# Patient Record
Sex: Male | Born: 1985 | Race: White | Hispanic: Yes | Marital: Single | Smoking: Never smoker
Health system: Southern US, Community
[De-identification: ages and names within clinical notes are randomized; demographics above are authoritative.]

## PROBLEM LIST (undated history)

## (undated) DIAGNOSIS — K219 Gastro-esophageal reflux disease without esophagitis: Secondary | ICD-10-CM

## (undated) DIAGNOSIS — Q909 Down syndrome, unspecified: Secondary | ICD-10-CM

## (undated) DIAGNOSIS — L728 Other follicular cysts of the skin and subcutaneous tissue: Secondary | ICD-10-CM

## (undated) DIAGNOSIS — K429 Umbilical hernia without obstruction or gangrene: Secondary | ICD-10-CM

## (undated) HISTORY — PX: MYRINGOTOMY: SUR874

## (undated) HISTORY — PX: NO PAST SURGERIES: SHX2092

---
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
## 2004-02-16 ENCOUNTER — Emergency Department (HOSPITAL_COMMUNITY): Admission: EM | Admit: 2004-02-16 | Discharge: 2004-02-16 | Payer: Self-pay | Admitting: Emergency Medicine

## 2004-02-17 ENCOUNTER — Emergency Department (HOSPITAL_COMMUNITY): Admission: EM | Admit: 2004-02-17 | Discharge: 2004-02-17 | Payer: Self-pay | Admitting: Emergency Medicine

## 2004-07-31 ENCOUNTER — Emergency Department (HOSPITAL_COMMUNITY): Admission: EM | Admit: 2004-07-31 | Discharge: 2004-07-31 | Payer: Self-pay | Admitting: Emergency Medicine

## 2006-06-08 ENCOUNTER — Emergency Department (HOSPITAL_COMMUNITY): Admission: EM | Admit: 2006-06-08 | Discharge: 2006-06-08 | Payer: Self-pay | Admitting: Emergency Medicine

## 2008-06-02 ENCOUNTER — Emergency Department (HOSPITAL_COMMUNITY): Admission: EM | Admit: 2008-06-02 | Discharge: 2008-06-03 | Payer: Self-pay | Admitting: Emergency Medicine

## 2008-08-31 ENCOUNTER — Emergency Department (HOSPITAL_COMMUNITY): Admission: EM | Admit: 2008-08-31 | Discharge: 2008-08-31 | Payer: Self-pay | Admitting: Emergency Medicine

## 2009-02-03 ENCOUNTER — Emergency Department (HOSPITAL_COMMUNITY): Admission: EM | Admit: 2009-02-03 | Discharge: 2009-02-03 | Payer: Self-pay | Admitting: Emergency Medicine

## 2009-04-30 ENCOUNTER — Emergency Department (HOSPITAL_COMMUNITY): Admission: EM | Admit: 2009-04-30 | Discharge: 2009-04-30 | Payer: Self-pay | Admitting: Family Medicine

## 2010-03-31 ENCOUNTER — Emergency Department (HOSPITAL_COMMUNITY)
Admission: EM | Admit: 2010-03-31 | Discharge: 2010-03-31 | Payer: Self-pay | Source: Home / Self Care | Admitting: Emergency Medicine

## 2010-03-31 LAB — BASIC METABOLIC PANEL
BUN: 16 mg/dL (ref 6–23)
GFR calc Af Amer: >60 mL/min (ref >60)
GFR calc non Af Amer: >60 mL/min (ref >60)
Potassium: 3.9 mEq/L (ref 3.5–5.1)
Sodium: 138 mEq/L (ref 135–145)

## 2010-03-31 LAB — DIFFERENTIAL
Basophils Absolute: 0.1 10*3/uL (ref 0.0–0.1)
Basophils Relative: 0 % (ref 0–1)
Eosinophils Absolute: 0 10*3/uL (ref 0.0–0.7)
Eosinophils Relative: 0 % (ref 0–5)
Neutrophils Relative %: 92 % — ABNORMAL HIGH (ref 43–77)

## 2010-03-31 LAB — CBC
Platelets: 247 10*3/uL (ref 150–400)
RDW: 13.8 % (ref 11.5–15.5)
WBC: 19.5 10*3/uL — ABNORMAL HIGH (ref 4.0–10.5)

## 2010-04-02 ENCOUNTER — Emergency Department (HOSPITAL_COMMUNITY)
Admission: EM | Admit: 2010-04-02 | Discharge: 2010-04-02 | Disposition: A | Discharge status: Home / Self Care | Payer: Medicaid Other | Attending: Emergency Medicine | Admitting: Emergency Medicine

## 2010-04-02 DIAGNOSIS — J020 Streptococcal pharyngitis: Secondary | ICD-10-CM | POA: Insufficient documentation

## 2010-04-02 LAB — RAPID STREP SCREEN (MED CTR MEBANE ONLY): Streptococcus, Group A Screen (Direct): POSITIVE — AB

## 2010-05-15 ENCOUNTER — Emergency Department (HOSPITAL_COMMUNITY): Payer: Medicaid Other

## 2010-05-15 ENCOUNTER — Emergency Department (HOSPITAL_COMMUNITY)
Admission: EM | Admit: 2010-05-15 | Discharge: 2010-05-15 | Disposition: A | Discharge status: Home / Self Care | Payer: Medicaid Other | Attending: Emergency Medicine | Admitting: Emergency Medicine

## 2010-05-15 DIAGNOSIS — X58XXXA Exposure to other specified factors, initial encounter: Secondary | ICD-10-CM | POA: Insufficient documentation

## 2010-05-15 DIAGNOSIS — Y9229 Other specified public building as the place of occurrence of the external cause: Secondary | ICD-10-CM | POA: Insufficient documentation

## 2010-05-15 DIAGNOSIS — F79 Unspecified intellectual disabilities: Secondary | ICD-10-CM | POA: Insufficient documentation

## 2010-05-15 DIAGNOSIS — S9030XA Contusion of unspecified foot, initial encounter: Secondary | ICD-10-CM | POA: Insufficient documentation

## 2010-05-15 DIAGNOSIS — Q909 Down syndrome, unspecified: Secondary | ICD-10-CM | POA: Insufficient documentation

## 2010-05-15 DIAGNOSIS — M7989 Other specified soft tissue disorders: Secondary | ICD-10-CM | POA: Insufficient documentation

## 2010-06-03 LAB — DIFFERENTIAL
Eosinophils Relative: 0 % (ref 0–5)
Lymphocytes Relative: 3 % — ABNORMAL LOW (ref 12–46)
Lymphs Abs: 0.3 10*3/uL — ABNORMAL LOW (ref 0.7–4.0)

## 2010-06-03 LAB — CBC
HCT: 45.6 % (ref 39.0–52.0)
Platelets: 236 10*3/uL (ref 150–400)
RBC: 4.76 MIL/uL (ref 4.22–5.81)
WBC: 10.3 10*3/uL (ref 4.0–10.5)

## 2010-06-03 LAB — BASIC METABOLIC PANEL
BUN: 15 mg/dL (ref 6–23)
GFR calc Af Amer: >60 mL/min (ref >60)
GFR calc non Af Amer: >60 mL/min (ref >60)
Potassium: 3.8 mEq/L (ref 3.5–5.1)
Sodium: 136 mEq/L (ref 135–145)

## 2010-06-08 LAB — COMPREHENSIVE METABOLIC PANEL
Alkaline Phosphatase: 70 U/L (ref 39–117)
BUN: 19 mg/dL (ref 6–23)
Chloride: 101 mEq/L (ref 96–112)
Glucose, Bld: 103 mg/dL — ABNORMAL HIGH (ref 70–99)
Potassium: 3.9 mEq/L (ref 3.5–5.1)
Total Bilirubin: 0.6 mg/dL (ref 0.3–1.2)

## 2010-06-08 LAB — DIFFERENTIAL
Basophils Absolute: 0 10*3/uL (ref 0.0–0.1)
Basophils Relative: 0 % (ref 0–1)
Monocytes Absolute: 0.5 10*3/uL (ref 0.1–1.0)
Neutro Abs: 11.9 10*3/uL — ABNORMAL HIGH (ref 1.7–7.7)
Neutrophils Relative %: 93 % — ABNORMAL HIGH (ref 43–77)

## 2010-06-08 LAB — URINALYSIS, ROUTINE W REFLEX MICROSCOPIC
Glucose, UA: NEGATIVE mg/dL
Ketones, ur: 15 mg/dL — AB
pH: 6.5 (ref 5.0–8.0)

## 2010-06-08 LAB — CBC
HCT: 43.2 % (ref 39.0–52.0)
Hemoglobin: 14.9 g/dL (ref 13.0–17.0)
WBC: 12.7 10*3/uL — ABNORMAL HIGH (ref 4.0–10.5)

## 2010-06-11 LAB — RAPID STREP SCREEN (MED CTR MEBANE ONLY): Streptococcus, Group A Screen (Direct): POSITIVE — AB

## 2012-04-04 ENCOUNTER — Emergency Department (INDEPENDENT_AMBULATORY_CARE_PROVIDER_SITE_OTHER): Payer: Medicaid Other

## 2012-04-04 ENCOUNTER — Emergency Department (HOSPITAL_COMMUNITY)
Admission: EM | Admit: 2012-04-04 | Discharge: 2012-04-04 | Disposition: A | Discharge status: Home / Self Care | Payer: Medicaid Other | Source: Home / Self Care | Attending: Family Medicine | Admitting: Family Medicine

## 2012-04-04 ENCOUNTER — Encounter (HOSPITAL_COMMUNITY): Payer: Self-pay

## 2012-04-04 DIAGNOSIS — J069 Acute upper respiratory infection, unspecified: Secondary | ICD-10-CM

## 2012-04-04 HISTORY — DX: Down syndrome, unspecified: Q90.9

## 2012-04-04 MED ORDER — IPRATROPIUM BROMIDE 0.06 % NA SOLN: 2.0000 | Freq: Four times a day (QID) | NASAL | Status: DC

## 2012-04-04 MED ORDER — AZITHROMYCIN 250 MG PO TABS: ORAL_TABLET | ORAL | Status: DC

## 2012-04-04 NOTE — ED Notes (Signed)
Dry non productive cough for approx 3 weeks with a fever sometimes at night, tried Robitussin and Motrin , cough is getting worse

## 2012-04-04 NOTE — ED Provider Notes (Signed)
History     CSN: [id number]  Arrival date & time 04/04/12  1851   First MD Initiated Contact with Patient 04/04/12 1909      Chief Complaint  Patient presents with  . Cough    (Consider location/radiation/quality/duration/timing/severity/associated sxs/prior treatment) Patient is a 27 y.o. male presenting with cough. The history is provided by a parent.  Cough This is a new problem. The current episode started more than 1 week ago (3 weeks of sx., worse since yest with rhinorrhea.). The cough is non-productive. There has been no fever. Associated symptoms include rhinorrhea. Pertinent negatives include no chills, no sweats, no shortness of breath and no wheezing. He is not a smoker.    Past Medical History  Diagnosis Date  . Down syndrome     History reviewed. No pertinent past surgical history.  No family history on file.  History  Substance Use Topics  . Smoking status: Never Smoker   . Smokeless tobacco: Not on file  . Alcohol Use: No      Review of Systems  Constitutional: Negative.  Negative for chills.  HENT: Positive for congestion, rhinorrhea and postnasal drip.   Respiratory: Positive for cough. Negative for shortness of breath and wheezing.   Gastrointestinal: Negative.     Allergies  Review of patient's allergies indicates no known allergies.  Home Medications   Current Outpatient Rx  Name  Route  Sig  Dispense  Refill  . AZITHROMYCIN 250 MG PO TABS      Take as directed on pack   6 each   0   . IPRATROPIUM BROMIDE 0.06 % NA SOLN   Nasal   Place 2 sprays into the nose 4 (four) times daily.   15 mL   1     BP 123/98  Pulse 63  Temp 98.3 F (36.8 C) (Oral)  Resp 20  SpO2 98%  Physical Exam  Vitals reviewed. Constitutional: He is oriented to person, place, and time. He appears well-developed and well-nourished.  HENT:  Head: Normocephalic.  Right Ear: External ear normal.  Left Ear: External ear normal.  Nose: Mucosal edema and  rhinorrhea present.  Mouth/Throat: Oropharynx is clear and moist.  Eyes: Conjunctivae normal are normal. Pupils are equal, round, and reactive to light.  Neck: Normal range of motion. Neck supple.  Cardiovascular: Normal rate, regular rhythm, normal heart sounds and intact distal pulses.   Pulmonary/Chest: Effort normal and breath sounds normal.  Lymphadenopathy:    He has no cervical adenopathy.  Neurological: He is alert and oriented to person, place, and time.  Skin: Skin is warm and dry.    ED Course  Procedures (including critical care time)  Labs Reviewed - No data to display Dg Chest 2 View  04/04/2012  *RADIOLOGY REPORT*  Clinical Data: Cough.  Chest soreness.  CHEST - 2 VIEW  Comparison: 03/31/2010  Findings: Low lung volumes are present, causing crowding of the pulmonary vasculature.  Mildly enlarged cardiac silhouette is likely attributable to the low lung volumes.  Airway thickening may reflect bronchitis or reactive airways disease.  No airspace opacity is identified to suggest bacterial pneumonia pattern.  No pleural effusion identified.  IMPRESSION:  1. Airway thickening may reflect bronchitis or reactive airways disease.  No airspace opacity is identified to suggest bacterial pneumonia pattern. 2.  Low lung volumes on the frontal projection.   Original Report Authenticated By: Gaylyn Rong, M.D.      1. URI (upper respiratory infection)  MDM  X-rays reviewed and report per radiologist.         Linna Hoff, MD 04/05/12 1336

## 2013-08-25 ENCOUNTER — Encounter (HOSPITAL_COMMUNITY): Payer: Self-pay | Admitting: Emergency Medicine

## 2013-08-25 ENCOUNTER — Emergency Department (HOSPITAL_COMMUNITY): Payer: Medicaid Other

## 2013-08-25 ENCOUNTER — Emergency Department (HOSPITAL_COMMUNITY)
Admission: EM | Admit: 2013-08-25 | Discharge: 2013-08-25 | Disposition: A | Discharge status: Home / Self Care | Payer: Medicaid Other | Attending: Emergency Medicine | Admitting: Emergency Medicine

## 2013-08-25 DIAGNOSIS — R197 Diarrhea, unspecified: Secondary | ICD-10-CM | POA: Insufficient documentation

## 2013-08-25 DIAGNOSIS — R059 Cough, unspecified: Secondary | ICD-10-CM

## 2013-08-25 DIAGNOSIS — R05 Cough: Secondary | ICD-10-CM | POA: Insufficient documentation

## 2013-08-25 DIAGNOSIS — Q909 Down syndrome, unspecified: Secondary | ICD-10-CM | POA: Insufficient documentation

## 2013-08-25 DIAGNOSIS — R112 Nausea with vomiting, unspecified: Secondary | ICD-10-CM | POA: Insufficient documentation

## 2013-08-25 DIAGNOSIS — R1110 Vomiting, unspecified: Secondary | ICD-10-CM

## 2013-08-25 LAB — CBC WITH DIFFERENTIAL/PLATELET
Basophils Absolute: 0 K/uL (ref 0.0–0.1)
Basophils Relative: 0 % (ref 0–1)
Eosinophils Absolute: 0 K/uL (ref 0.0–0.7)
Eosinophils Relative: 0 % (ref 0–5)
HCT: 41.2 % (ref 39.0–52.0)
Hemoglobin: 14.2 g/dL (ref 13.0–17.0)
Lymphocytes Relative: 5 % — ABNORMAL LOW (ref 12–46)
Lymphs Abs: 0.5 K/uL — ABNORMAL LOW (ref 0.7–4.0)
MCH: 32.1 pg (ref 26.0–34.0)
MCHC: 34.5 g/dL (ref 30.0–36.0)
MCV: 93.2 fL (ref 78.0–100.0)
Monocytes Absolute: 0.5 K/uL (ref 0.1–1.0)
Monocytes Relative: 5 % (ref 3–12)
Neutro Abs: 9.4 K/uL — ABNORMAL HIGH (ref 1.7–7.7)
Neutrophils Relative %: 90 % — ABNORMAL HIGH (ref 43–77)
Platelets: 229 K/uL (ref 150–400)
RBC: 4.42 MIL/uL (ref 4.22–5.81)
RDW: 14 % (ref 11.5–15.5)
WBC: 10.4 K/uL (ref 4.0–10.5)

## 2013-08-25 LAB — COMPREHENSIVE METABOLIC PANEL
ALK PHOS: 86 U/L (ref 39–117)
ALT: 17 U/L (ref 0–53)
AST: 18 U/L (ref 0–37)
Albumin: 3.9 g/dL (ref 3.5–5.2)
BILIRUBIN TOTAL: 0.3 mg/dL (ref 0.3–1.2)
BUN: 10 mg/dL (ref 6–23)
CHLORIDE: 100 meq/L (ref 96–112)
CO2: 27 mEq/L (ref 19–32)
CREATININE: 1.13 mg/dL (ref 0.50–1.35)
Calcium: 8.9 mg/dL (ref 8.4–10.5)
GFR calc non Af Amer: 87 mL/min — ABNORMAL LOW (ref >90)
GLUCOSE: 96 mg/dL (ref 70–99)
POTASSIUM: 3.8 meq/L (ref 3.7–5.3)
Sodium: 138 mEq/L (ref 137–147)
Total Protein: 7.3 g/dL (ref 6.0–8.3)

## 2013-08-25 LAB — LIPASE, BLOOD: Lipase: 21 U/L (ref 11–59)

## 2013-08-25 MED ORDER — ONDANSETRON 4 MG PO TBDP
4.0000 mg | ORAL_TABLET | Freq: Once | ORAL | Status: AC
  Administered 2013-08-25: 4 mg via ORAL
  Filled 2013-08-25: qty 1

## 2013-08-25 MED ORDER — ONDANSETRON HCL 4 MG PO TABS: 4.0000 mg | ORAL_TABLET | Freq: Four times a day (QID) | ORAL | Status: DC

## 2013-08-25 MED ORDER — BENZONATATE 100 MG PO CAPS: 100.0000 mg | ORAL_CAPSULE | Freq: Three times a day (TID) | ORAL | Status: DC

## 2013-08-25 NOTE — ED Provider Notes (Signed)
CSN: [id number]     Arrival date & time 08/25/13  1935 History   First MD Initiated Contact with Patient 08/25/13 2033     Chief Complaint  Patient presents with  . Cough  . Emesis     (Consider location/radiation/quality/duration/timing/severity/associated sxs/prior Treatment) HPI Level V caveat- pt has down syndrome Patient to the ER bib father for evaluation of his coughing and vomiting. He has down syndrome and is an otherwise healthy patient who does not take any daily medications.  He goes to an activity home during week days and today he had lunch there and started vomiting afterwards. The father says the activity place called him and said that a couple of the patients have been vomiting since having lunch. The father says he has not been acting as though he is having pain. He has not had any fever. He has had loose stool that is non mucous and non bloody. His vomit is whatever food or drink, non bilious and non bloody. He has not had anything for the vomiting prior to arrival. The father feels that he is now coughing before he vomiting. He reports that otherwise he has been acting normal and he is concerned that it may be a virus. Patient denies having pain. Filed Vitals:   08/25/13 2009  BP: 126/75  Pulse: 85  Temp: 98.4 F (36.9 C)  Resp: 20     Past Medical History  Diagnosis Date  . Down syndrome    Past Surgical History  Procedure Laterality Date  . Myringotomy     Family History  Problem Relation Age of Onset  . Hypertension Mother   . Cancer Mother    History  Substance Use Topics  . Smoking status: Never Smoker   . Smokeless tobacco: Not on file  . Alcohol Use: No    Review of Systems   Review of Systems  Gen: no weight loss, fevers, chills, night sweats  Eyes: no discharge or drainage, no occular pain or visual changes  Nose: no epistaxis or rhinorrhea  Mouth: no dental pain, no sore throat  Neck: no neck pain  Lungs:No wheezing, coughing or  hemoptysis CV: no chest pain, palpitations, dependent edema or orthopnea  Abd: no abdominal pain, + nausea, vomiting, diarrhea GU: no dysuria or gross hematuria  MSK:  No muscle weakness or pain Neuro: no headache, no focal neurologic deficits  Skin: no rash or wounds Psyche: no complaints    Allergies  Review of patient's allergies indicates no known allergies.  Home Medications   Prior to Admission medications   Medication Sig Start Date End Date Taking? Authorizing Tanairy Payeur  ibuprofen (ADVIL,MOTRIN) 200 MG tablet Take 200 mg by mouth every 6 (six) hours as needed (pain).   Yes Historical Ardyn Forge, MD  benzonatate (TESSALON) 100 MG capsule Take 1 capsule (100 mg total) by mouth every 8 (eight) hours. 08/25/13   Tiffany Irine SealG Greene, PA-C   BP 126/75  Pulse 85  Temp(Src) 98.4 F (36.9 C) (Oral)  Resp 20  SpO2 96% Physical Exam  Nursing note and vitals reviewed. Constitutional: He appears well-developed and well-nourished. No distress.  HENT:  Head: Normocephalic and atraumatic.  Right Ear: External ear normal.  Left Ear: External ear normal.  Nose: Nose normal.  Mouth/Throat: Oropharynx is clear and moist.  Eyes: Pupils are equal, round, and reactive to light.  Neck: Normal range of motion. Neck supple.  Cardiovascular: Normal rate and regular rhythm.   Pulmonary/Chest: Effort normal.  Abdominal: Soft.  He exhibits no distension and no fluid wave. Bowel sounds are increased. There is no hepatosplenomegaly. There is no tenderness. There is no rigidity, no rebound, no guarding and no CVA tenderness.  Neurological: He is alert.  Pt mentating at baseline per father.   Skin: Skin is warm and dry.    ED Course  Procedures (including critical care time) Labs Review Labs Reviewed  CBC WITH DIFFERENTIAL - Abnormal; Notable for the following:    Neutrophils Relative % 90 (*)    Neutro Abs 9.4 (*)    Lymphocytes Relative 5 (*)    Lymphs Abs 0.5 (*)    All other components  within normal limits  COMPREHENSIVE METABOLIC PANEL - Abnormal; Notable for the following:    GFR calc non Af Amer 87 (*)    All other components within normal limits  LIPASE, BLOOD    Imaging Review Dg Abd Acute W/chest  08/25/2013   CLINICAL DATA:  Cough.  Vomiting.  EXAM: ACUTE ABDOMEN SERIES (ABDOMEN 2 VIEW & CHEST 1 VIEW)  COMPARISON:  April 04, 2012.  FINDINGS: There is no evidence of dilated bowel loops or free intraperitoneal air. No radiopaque calculi or other significant radiographic abnormality is seen. Heart size and mediastinal contours are within normal limits. Both lungs are clear.  IMPRESSION: Negative abdominal radiographs.  No acute cardiopulmonary disease.   Electronically Signed   By: Roque LiasJames  Green M.D.   On: 08/25/2013 21:35     EKG Interpretation None      MDM   Final diagnoses:  Cough  Post-tussive vomiting    Labs and imaging are reassuring. Pt passed fluids challenge in the ED. Dr. Ethelda ChickJacubowitz has seen patient as well. Will rx Tessalon perls and Zofran. Pt to follow-up with PCP..  28 y.o.Levi Powell D Fetterolf's evaluation in the Emergency Department is complete. It has been determined that no acute conditions requiring further emergency intervention are present at this time. The patient/guardian have been advised of the diagnosis and plan. We have discussed signs and symptoms that warrant return to the ED, such as changes or worsening in symptoms.  Vital signs are stable at discharge. Filed Vitals:   08/25/13 2009  BP: 126/75  Pulse: 85  Temp: 98.4 F (36.9 C)  Resp: 20    Patient/guardian has voiced understanding and agreed to follow-up with the PCP or specialist.     Dorthula Matasiffany G Greene, PA-C 08/25/13 2258  Dorthula Matasiffany G Greene, PA-C 08/25/13 2259

## 2013-08-25 NOTE — ED Provider Notes (Signed)
Level V caveat patient down syndrome Patient with nonproductive cough and posttussive vomiting which started yesterday patient reports he is presently hungry. Treated at home with ibuprofen. On exam patient is alert not ill-appearing. Lungs clear auscultation heart regular in rhythm abdomen soft nontender.  Doug SouSam Jacubowitz, MD 08/25/13 2219

## 2013-08-25 NOTE — Discharge Instructions (Signed)
Cough, Adult  A cough is a reflex that helps clear your throat and airways. It can help heal the body or may be a reaction to an irritated airway. A cough may only last 2 or 3 weeks (acute) or may last more than 8 weeks (chronic).  CAUSES Acute cough:  Viral or bacterial infections. Chronic cough:  Infections.  Allergies.  Asthma.  Post-nasal drip.  Smoking.  Heartburn or acid reflux.  Some medicines.  Chronic lung problems (COPD).  Cancer. SYMPTOMS   Cough.  Fever.  Chest pain.  Increased breathing rate.  High-pitched whistling sound when breathing (wheezing).  Colored mucus that you cough up (sputum). TREATMENT   A bacterial cough may be treated with antibiotic medicine.  A viral cough must run its course and will not respond to antibiotics.  Your caregiver may recommend other treatments if you have a chronic cough. HOME CARE INSTRUCTIONS   Only take over-the-counter or prescription medicines for pain, discomfort, or fever as directed by your caregiver. Use cough suppressants only as directed by your caregiver.  Use a cold steam vaporizer or humidifier in your bedroom or home to help loosen secretions.  Sleep in a semi-upright position if your cough is worse at night.  Rest as needed.  Stop smoking if you smoke. SEEK IMMEDIATE MEDICAL CARE IF:   You have pus in your sputum.  Your cough starts to worsen.  You cannot control your cough with suppressants and are losing sleep.  You begin coughing up blood.  You have difficulty breathing.  You develop pain which is getting worse or is uncontrolled with medicine.  You have a fever. MAKE SURE YOU:   Understand these instructions.  Will watch your condition.  Will get help right away if you are not doing well or get worse. Document Released: 08/15/2010 Document Revised: 05/11/2011 Document Reviewed: 08/15/2010 [employer] Patient Information 2015 [employer]. This information is not intended  to replace advice given to you by your health care provider. Make sure you discuss any questions you have with your health care provider.  

## 2013-08-25 NOTE — ED Notes (Signed)
Visitor states pt started having vomiting this morning and has had soft stools like the beginning of diarrhea  Pt has a nonproductive persistant cough noted in triage  Family states that started today as well  Family states pt was fine yesterday

## 2013-08-26 NOTE — ED Provider Notes (Signed)
Medical screening examination/treatment/procedure(s) were conducted as a shared visit with non-physician practitioner(s) and myself.  I personally evaluated the patient during the encounter.   EKG Interpretation None       Sam Jacubowitz, MD 08/26/13 0044 

## 2014-09-26 ENCOUNTER — Encounter (HOSPITAL_COMMUNITY): Payer: Self-pay | Admitting: *Deleted

## 2014-09-26 ENCOUNTER — Emergency Department (HOSPITAL_COMMUNITY)
Admission: EM | Admit: 2014-09-26 | Discharge: 2014-09-26 | Disposition: A | Discharge status: Home / Self Care | Payer: Medicaid Other | Attending: Emergency Medicine | Admitting: Emergency Medicine

## 2014-09-26 DIAGNOSIS — R1110 Vomiting, unspecified: Secondary | ICD-10-CM | POA: Insufficient documentation

## 2014-09-26 DIAGNOSIS — R109 Unspecified abdominal pain: Secondary | ICD-10-CM | POA: Insufficient documentation

## 2014-09-26 DIAGNOSIS — J302 Other seasonal allergic rhinitis: Secondary | ICD-10-CM

## 2014-09-26 DIAGNOSIS — R05 Cough: Secondary | ICD-10-CM | POA: Diagnosis present

## 2014-09-26 DIAGNOSIS — Q909 Down syndrome, unspecified: Secondary | ICD-10-CM | POA: Insufficient documentation

## 2014-09-26 DIAGNOSIS — R059 Cough, unspecified: Secondary | ICD-10-CM

## 2014-09-26 MED ORDER — GUAIFENESIN-DM 100-10 MG/5ML PO SYRP: 5.0000 mL | ORAL_SOLUTION | ORAL | Status: DC | PRN

## 2014-09-26 MED ORDER — AZITHROMYCIN 250 MG PO TABS: 250.0000 mg | ORAL_TABLET | Freq: Every day | ORAL | Status: DC

## 2014-09-26 MED ORDER — LORATADINE 10 MG PO TABS: 10.0000 mg | ORAL_TABLET | Freq: Every day | ORAL | Status: DC

## 2014-09-26 MED ORDER — FLUTICASONE PROPIONATE 50 MCG/ACT NA SUSP: 2.0000 | Freq: Every day | NASAL | Status: DC

## 2014-09-26 NOTE — Discharge Instructions (Signed)
Allergic Rhinitis Allergic rhinitis is when the mucous membranes in the nose respond to allergens. Allergens are particles in the air that cause your body to have an allergic reaction. This causes you to release allergic antibodies. Through a chain of events, these eventually cause you to release histamine into the blood stream. Although meant to protect the body, it is this release of histamine that causes your discomfort, such as frequent sneezing, congestion, and an itchy, runny nose.  CAUSES  Seasonal allergic rhinitis (hay fever) is caused by pollen allergens that may come from grasses, trees, and weeds. Year-round allergic rhinitis (perennial allergic rhinitis) is caused by allergens such as house dust mites, pet dander, and mold spores.  SYMPTOMS   Nasal stuffiness (congestion).  Itchy, runny nose with sneezing and tearing of the eyes. DIAGNOSIS  Your health care provider can help you determine the allergen or allergens that trigger your symptoms. If you and your health care provider are unable to determine the allergen, skin or blood testing may be used. TREATMENT  Allergic rhinitis does not have a cure, but it can be controlled by:  Medicines and allergy shots (immunotherapy).  Avoiding the allergen. Hay fever may often be treated with antihistamines in pill or nasal spray forms. Antihistamines block the effects of histamine. There are over-the-counter medicines that may help with nasal congestion and swelling around the eyes. Check with your health care provider before taking or giving this medicine.  If avoiding the allergen or the medicine prescribed do not work, there are many new medicines your health care provider can prescribe. Stronger medicine may be used if initial measures are ineffective. Desensitizing injections can be used if medicine and avoidance does not work. Desensitization is when a patient is given ongoing shots until the body becomes less sensitive to the allergen.  Make sure you follow up with your health care provider if problems continue. HOME CARE INSTRUCTIONS It is not possible to completely avoid allergens, but you can reduce your symptoms by taking steps to limit your exposure to them. It helps to know exactly what you are allergic to so that you can avoid your specific triggers. SEEK MEDICAL CARE IF:   You have a fever.  You develop a cough that does not stop easily (persistent).  You have shortness of breath.  You start wheezing.  Symptoms interfere with normal daily activities. Document Released: 11/11/2000 Document Revised: 02/21/2013 Document Reviewed: 10/24/2012 [facility] Patient Information 2015 [place], [place]. This information is not intended to replace advice given to you by your health care provider. Make sure you discuss any questions you have with your health care provider.  Please use medication as directed. Please contact her primary care physician and inform them of your visit today, please return immediately if new or worsening signs or symptoms present.

## 2014-09-26 NOTE — ED Notes (Addendum)
Pt complains of n/v/left side abd pain since yesterday afternoon. Pt's mother states the pt appeared to be sick after coming home from camp yesterday. Pt's mother states the pt has also had cough/congestion/sore throat for the past 3 days. Pt has hx of down syndrome

## 2014-09-26 NOTE — ED Provider Notes (Signed)
CSN: [id number]     Arrival date & time 09/26/14  1245 History   None    No chief complaint on file.   HPI   Level V caveat due to Down syndrome   29 year old male presents today with family with complaints of upper respiratory symptoms, cough, and subsequent vomiting. Family notes that once a year patient experiences similar symptoms, stating that he has been going to a recreational camp outside noting that since then he's had a runny nose, watery eyes, and nonproductive cough. They state that symptoms are worse when he comes home, improved to stay at home. They note that over the last 2 days the cough has worsened in frequency, resulting in vomiting after coughing spells. Denies respiratory distress, fever, chills, vomiting without cough, rash, insect bites, or any other people can't experiencing similar symptoms. They deny a history of allergies, reports similar symptoms with outside exposure. They state that other times a year he remains inside with little exposure to the outdoors. They report patient has been eating and drinking appropriately, but notes that after severe coughing spells he vomits. Patient is minimally verbal, reports throat pain, and abdominal discomfort. Family notes that he has been complaining of the throat more than anything, they're not concerned that his abdominal discomfort. Patient does not currently take any over-the-counter medications, has been acting appropriately, with normal bowel and bladder habits. Family notes that they have been checking him for tick bites and noted no rash or insect bites.  Past Medical History  Diagnosis Date  . Down syndrome    Past Surgical History  Procedure Laterality Date  . Myringotomy     Family History  Problem Relation Age of Onset  . Hypertension Mother   . Cancer Mother    History  Substance Use Topics  . Smoking status: Never Smoker   . Smokeless tobacco: Not on file  . Alcohol Use: No    Review of Systems  All  other systems reviewed and are negative.   Allergies  Review of patient's allergies indicates no known allergies.  Home Medications   Prior to Admission medications   Medication Sig Start Date End Date Taking? Authorizing Provider  ibuprofen (ADVIL,MOTRIN) 200 MG tablet Take 200 mg by mouth every 6 (six) hours as needed (pain).   Yes Historical Provider, MD  azithromycin (ZITHROMAX) 250 MG tablet Take 1 tablet (250 mg total) by mouth daily. Take first 2 tablets together, then 1 every day until finished. 09/26/14   Eyvonne Mechanic, PA-C  fluticasone (FLONASE) 50 MCG/ACT nasal spray Place 2 sprays into both nostrils daily. 09/26/14   Tinnie Gens Deangleo Passage, PA-C  guaiFENesin-dextromethorphan (ROBITUSSIN DM) 100-10 MG/5ML syrup Take 5 mLs by mouth every 4 (four) hours as needed for cough. 09/26/14   Eyvonne Mechanic, PA-C  loratadine (CLARITIN) 10 MG tablet Take 1 tablet (10 mg total) by mouth daily. 09/26/14   Aleasha Fregeau, PA-C   BP 132/76 mmHg  Pulse 72  Temp(Src) 98.2 F (36.8 C) (Oral)  Resp 20  SpO2 98%   Physical Exam  Constitutional: He is oriented to person, place, and time. He appears well-developed and well-nourished.  HENT:  Head: Normocephalic and atraumatic.  Right Ear: Hearing, tympanic membrane, external ear and ear canal normal.  Left Ear: Hearing, tympanic membrane, external ear and ear canal normal.  Nose: Mucosal edema and rhinorrhea present.  Mouth/Throat: Uvula is midline, oropharynx is clear and moist and mucous membranes are normal. No oropharyngeal exudate, posterior oropharyngeal edema, posterior oropharyngeal erythema  or tonsillar abscesses.  Eyes: Conjunctivae are normal. Pupils are equal, round, and reactive to light. Right eye exhibits no discharge. Left eye exhibits no discharge. No scleral icterus.  Neck: Normal range of motion. No JVD present. No tracheal deviation present.  Cardiovascular: Normal rate, regular rhythm, normal heart sounds and intact distal pulses.   Exam reveals no gallop and no friction rub.   No murmur heard. Pulmonary/Chest: Effort normal and breath sounds normal. No stridor. No respiratory distress. He has no wheezes. He has no rales. He exhibits no tenderness.  Abdominal: Soft. Bowel sounds are normal. He exhibits no distension and no mass. There is no tenderness. There is no rebound and no guarding.  Musculoskeletal: Normal range of motion. He exhibits no edema.  Neurological: He is alert and oriented to person, place, and time. Coordination normal.  Skin: Skin is warm and dry. No rash noted. No erythema. No pallor.  No rashes or insect bites  Psychiatric: He has a normal mood and affect. His behavior is normal. Judgment and thought content normal.  Nursing note and vitals reviewed.     ED Course  Procedures (including critical care time) Labs Review Labs Reviewed - No data to display  Imaging Review No results found.   EKG Interpretation None      MDM   Final diagnoses:  Other seasonal allergic rhinitis  Cough    Labs:  Imaging:  Consults:  Therapeutics:   Discharge Meds: Claritin, Flonase, azithromycin  Assessment/Plan: Patient presents with likely allergies. Similar presentation over the last several years while attending camp outside, normally inside. Rhinorrhea, cough worse with laying back, improved with avoidance of outdoor exposure. Patient is afebrile, vital signs normal, lungs sounds clear. No difficulty swallowing, drooling, dysphonia,muffled voice, stridor, swelling of the neck, trismus, mouth pain, swelling/ pain in submandibular area or floor of mouth ,assymetry of tonsils, or ulcerations; unlikely epiglottitis, PTA, submandibular space infection, retropharyngeal space infection, or HIV. Patient's family instructed to use Claritin, Flonase, avoid allergens. They report receiving azithromycin previously that help alleviate symptoms, I informed this is likely allergic, less likely viral, even more  less likely bacterial. Patient will be given a course of azithromycin, they're strongly encouraged to avoid using antibiotics unless symptoms worsen or do not improve after 7 days. Patient's family given strict return precautions, PCP follow-up for re-evaluation if symptoms persist beyond 5-7 days in duration, return to the ED if they worsen. Pt verbalized understanding and agreement to today's plan and had no further questions or concerns at the time of discharge.          Eyvonne Mechanic, PA-C 09/27/14 1610  Blake Divine, MD 09/28/14 [phone]

## 2015-03-20 ENCOUNTER — Emergency Department (HOSPITAL_COMMUNITY): Payer: Medicare Other

## 2015-03-20 ENCOUNTER — Encounter (HOSPITAL_COMMUNITY): Payer: Self-pay | Admitting: Emergency Medicine

## 2015-03-20 ENCOUNTER — Inpatient Hospital Stay (HOSPITAL_COMMUNITY)
Admission: EM | Admit: 2015-03-20 | Discharge: 2015-03-22 | DRG: 872 | Disposition: A | Discharge status: Home / Self Care | Payer: Medicare Other | Attending: Internal Medicine | Admitting: Internal Medicine

## 2015-03-20 DIAGNOSIS — R1111 Vomiting without nausea: Secondary | ICD-10-CM | POA: Diagnosis not present

## 2015-03-20 DIAGNOSIS — D72829 Elevated white blood cell count, unspecified: Secondary | ICD-10-CM

## 2015-03-20 DIAGNOSIS — Q909 Down syndrome, unspecified: Secondary | ICD-10-CM

## 2015-03-20 DIAGNOSIS — R6520 Severe sepsis without septic shock: Secondary | ICD-10-CM | POA: Diagnosis present

## 2015-03-20 DIAGNOSIS — R1110 Vomiting, unspecified: Secondary | ICD-10-CM | POA: Diagnosis present

## 2015-03-20 DIAGNOSIS — R05 Cough: Secondary | ICD-10-CM | POA: Diagnosis not present

## 2015-03-20 DIAGNOSIS — R059 Cough, unspecified: Secondary | ICD-10-CM

## 2015-03-20 DIAGNOSIS — Z79899 Other long term (current) drug therapy: Secondary | ICD-10-CM

## 2015-03-20 DIAGNOSIS — A419 Sepsis, unspecified organism: Secondary | ICD-10-CM | POA: Diagnosis not present

## 2015-03-20 DIAGNOSIS — R509 Fever, unspecified: Secondary | ICD-10-CM | POA: Diagnosis not present

## 2015-03-20 DIAGNOSIS — N3001 Acute cystitis with hematuria: Secondary | ICD-10-CM | POA: Diagnosis present

## 2015-03-20 DIAGNOSIS — N3090 Cystitis, unspecified without hematuria: Secondary | ICD-10-CM | POA: Diagnosis not present

## 2015-03-20 DIAGNOSIS — N390 Urinary tract infection, site not specified: Secondary | ICD-10-CM | POA: Diagnosis present

## 2015-03-20 LAB — PROTIME-INR
INR: 1.15 (ref 0.00–1.49)
Prothrombin Time: 14.9 seconds (ref 11.6–15.2)

## 2015-03-20 LAB — COMPREHENSIVE METABOLIC PANEL
ALT: 18 U/L (ref 17–63)
ANION GAP: 10 (ref 5–15)
AST: 17 U/L (ref 15–41)
Albumin: 3.9 g/dL (ref 3.5–5.0)
Alkaline Phosphatase: 63 U/L (ref 38–126)
BUN: 15 mg/dL (ref 6–20)
CO2: 26 mmol/L (ref 22–32)
Calcium: 8.7 mg/dL — ABNORMAL LOW (ref 8.9–10.3)
Chloride: 101 mmol/L (ref 101–111)
Creatinine, Ser: 1.21 mg/dL (ref 0.61–1.24)
Glucose, Bld: 122 mg/dL — ABNORMAL HIGH (ref 65–99)
POTASSIUM: 3.7 mmol/L (ref 3.5–5.1)
Sodium: 137 mmol/L (ref 135–145)
Total Bilirubin: 0.8 mg/dL (ref 0.3–1.2)
Total Protein: 7.5 g/dL (ref 6.5–8.1)

## 2015-03-20 LAB — URINALYSIS, ROUTINE W REFLEX MICROSCOPIC
GLUCOSE, UA: NEGATIVE mg/dL
Ketones, ur: NEGATIVE mg/dL
Nitrite: NEGATIVE
PH: 6 (ref 5.0–8.0)
Protein, ur: 30 mg/dL — AB
SPECIFIC GRAVITY, URINE: 1.027 (ref 1.005–1.030)

## 2015-03-20 LAB — URINE MICROSCOPIC-ADD ON

## 2015-03-20 LAB — CBC
HEMATOCRIT: 39.5 % (ref 39.0–52.0)
HEMOGLOBIN: 13.1 g/dL (ref 13.0–17.0)
MCH: 31.3 pg (ref 26.0–34.0)
MCHC: 33.2 g/dL (ref 30.0–36.0)
MCV: 94.5 fL (ref 78.0–100.0)
PLATELETS: 297 10*3/uL (ref 150–400)
RBC: 4.18 MIL/uL — AB (ref 4.22–5.81)
RDW: 14.2 % (ref 11.5–15.5)
WBC: 20.1 10*3/uL — AB (ref 4.0–10.5)

## 2015-03-20 LAB — APTT: APTT: 28 s (ref 24–37)

## 2015-03-20 LAB — LIPASE, BLOOD: LIPASE: 14 U/L (ref 11–51)

## 2015-03-20 LAB — LACTIC ACID, PLASMA: LACTIC ACID, VENOUS: 0.7 mmol/L (ref 0.5–2.0)

## 2015-03-20 LAB — I-STAT CG4 LACTIC ACID, ED
LACTIC ACID, VENOUS: 0.69 mmol/L (ref 0.5–2.0)
Lactic Acid, Venous: 1.12 mmol/L (ref 0.5–2.0)

## 2015-03-20 LAB — BRAIN NATRIURETIC PEPTIDE: B Natriuretic Peptide: 43.7 pg/mL (ref 0.0–100.0)

## 2015-03-20 LAB — PROCALCITONIN

## 2015-03-20 MED ORDER — HYDROCODONE-ACETAMINOPHEN 5-325 MG PO TABS
1.0000 | ORAL_TABLET | ORAL | Status: DC | PRN
  Administered 2015-03-21: 1 via ORAL
  Filled 2015-03-20: qty 1

## 2015-03-20 MED ORDER — ONDANSETRON HCL 4 MG PO TABS: 4.0000 mg | ORAL_TABLET | Freq: Four times a day (QID) | ORAL | Status: DC | PRN

## 2015-03-20 MED ORDER — CEFTRIAXONE SODIUM 2 G IJ SOLR
2.0000 g | Freq: Once | INTRAMUSCULAR | Status: AC
  Administered 2015-03-20: 2 g via INTRAVENOUS
  Filled 2015-03-20: qty 2

## 2015-03-20 MED ORDER — POTASSIUM CHLORIDE IN NACL 20-0.9 MEQ/L-% IV SOLN
INTRAVENOUS | Status: DC
  Administered 2015-03-20 – 2015-03-21 (×2): via INTRAVENOUS
  Filled 2015-03-20 (×3): qty 1000

## 2015-03-20 MED ORDER — BISACODYL 5 MG PO TBEC
5.0000 mg | DELAYED_RELEASE_TABLET | Freq: Every day | ORAL | Status: DC | PRN
  Filled 2015-03-20: qty 1

## 2015-03-20 MED ORDER — ONDANSETRON HCL 4 MG/2ML IJ SOLN: 4.0000 mg | Freq: Four times a day (QID) | INTRAMUSCULAR | Status: DC | PRN

## 2015-03-20 MED ORDER — SODIUM CHLORIDE 0.9 % IJ SOLN
3.0000 mL | Freq: Two times a day (BID) | INTRAMUSCULAR | Status: DC
Start: 2015-03-20 — End: 2015-03-22
  Administered 2015-03-20 – 2015-03-22 (×4): 3 mL via INTRAVENOUS

## 2015-03-20 MED ORDER — SENNOSIDES-DOCUSATE SODIUM 8.6-50 MG PO TABS: 1.0000 | ORAL_TABLET | Freq: Every evening | ORAL | Status: DC | PRN

## 2015-03-20 MED ORDER — SODIUM CHLORIDE 0.9 % IV SOLN
1000.0000 mL | Freq: Once | INTRAVENOUS | Status: AC
  Administered 2015-03-20: 1000 mL via INTRAVENOUS

## 2015-03-20 MED ORDER — SODIUM CHLORIDE 0.9 % IV SOLN
1000.0000 mL | INTRAVENOUS | Status: DC
  Administered 2015-03-20: 1000 mL via INTRAVENOUS

## 2015-03-20 MED ORDER — ENOXAPARIN SODIUM 40 MG/0.4ML ~~LOC~~ SOLN
40.0000 mg | Freq: Every day | SUBCUTANEOUS | Status: DC
  Filled 2015-03-20 (×2): qty 0.4

## 2015-03-20 MED ORDER — ACETAMINOPHEN 650 MG RE SUPP: 650.0000 mg | Freq: Four times a day (QID) | RECTAL | Status: DC | PRN

## 2015-03-20 MED ORDER — MORPHINE SULFATE (PF) 2 MG/ML IV SOLN: 1.0000 mg | INTRAVENOUS | Status: DC | PRN

## 2015-03-20 MED ORDER — LEVOFLOXACIN IN D5W 750 MG/150ML IV SOLN
750.0000 mg | Freq: Once | INTRAVENOUS | Status: AC
  Administered 2015-03-20: 750 mg via INTRAVENOUS
  Filled 2015-03-20: qty 150

## 2015-03-20 MED ORDER — ACETAMINOPHEN 325 MG PO TABS: 650.0000 mg | ORAL_TABLET | Freq: Four times a day (QID) | ORAL | Status: DC | PRN

## 2015-03-20 NOTE — ED Notes (Signed)
Per mother, on antibiotic for cough-coughing up mucous-states notice blood in his urine this am

## 2015-03-20 NOTE — H&P (Signed)
[facility] History and Physical  [name] BJY:[id number] DOB: 1985/06/12 DOA: 03/20/2015  Referring physician: ED physician PCP: Rubye Beach  Specialists:   Chief Complaint:  Fevers, cough, hematuria  HPI: Levi Powell is a 30 y.o. male with PMH of trisomy 71, but generally well, who presents to the ED after several weeks of intermittent fevers, cough, and myalgias, with gross hematuria noted this morning. Approximately 6 weeks ago, the patient developed a fever and cough, eventually saw his PCP, and was prescribed a course of amoxicillin. There was initial improvement and the patient defervesced, but over the last week he had been having increasingly fatigued, and on the morning of his admission his mother noted frank blood in his urine. The patient has had no history of kidney stones or UTI, and has never had gross hematuria previously.  In ED, patient was found to be afebrile, saturating well on room air, with heart rate initially in the 120s and blood pressure is marginal. A CT of the abdomen was obtained, demonstrating urinary bladder wall thickening, but no stones. Blood work returned with a leukocytosis greater than 20,000 and urinalysis features many bacteria and small leukocytes. Patient received a 1 L bolus of normal saline with subsequent improvement in tachycardia. An empiric dose of IV Levaquin was given for UTI and the patient was admitted to the hospital for ongoing evaluation and management of sepsis secondary to UTI.  Where does patient live?   At home    Can patient participate in ADLs?  Some   Review of Systems:   Unable to obtain secondary to patient's non-verbal status. His parents note post-tussive vomiting.   Allergy: No Known Allergies  Past Medical History  Diagnosis Date  . Down syndrome     Past Surgical History  Procedure Laterality Date  . Myringotomy    . No past surgeries      Social History:  reports that he has never  smoked. He does not have any smokeless tobacco history on file. He reports that he does not drink alcohol or use illicit drugs.  Family History:  Family History  Problem Relation Age of Onset  . Hypertension Mother   . Cancer Mother      Prior to Admission medications   Medication Sig Start Date End Date Taking? Authorizing Provider  azithromycin (ZITHROMAX) 250 MG tablet Take 1 tablet (250 mg total) by mouth daily. Take first 2 tablets together, then 1 every day until finished. Patient not taking: Reported on 03/20/2015 09/26/14   Eyvonne Mechanic, PA-C  fluticasone [facility]) 50 MCG/ACT nasal spray Place 2 sprays into both nostrils daily. Patient not taking: Reported on 03/20/2015 09/26/14   Eyvonne Mechanic, PA-C  guaiFENesin-dextromethorphan (ROBITUSSIN DM) 100-10 MG/5ML syrup Take 5 mLs by mouth every 4 (four) hours as needed for cough. Patient not taking: Reported on 03/20/2015 09/26/14   Eyvonne Mechanic, PA-C  ibuprofen (ADVIL,MOTRIN) 200 MG tablet Take 200 mg by mouth every 6 (six) hours as needed (pain).    Historical Provider, MD  loratadine (CLARITIN) 10 MG tablet Take 1 tablet (10 mg total) by mouth daily. Patient not taking: Reported on 03/20/2015 09/26/14   Eyvonne Mechanic, PA-C    Physical Exam: Filed Vitals:   03/20/15 1451 03/20/15 1715  BP: 98/71 130/74  Pulse: 123 90  Temp: 99 F (37.2 C)   TempSrc: Oral   Resp: 18 17  SpO2: 93% 97%   General: Not in acute distress. Down facies. HEENT:  Eyes: PERRL, EOMI, no scleral icterus or conjunctival pallor.       ENT: No discharge from the ears or nose, no pharyngeal ulcers, petechiae or exudate, no tonsillar enlargement.        Neck: No JVD, no bruit, no appreciable mass Heme: No cervical adenopathy, no pallor Cardiac: S1/S2, RRR, No murmurs, No gallops or rubs. Pulm: Good air movement bilaterally, no wheeze or ronchi. Abd: Soft, nondistended, suprapubic tenderness, no rebound pain or gaurding, no mass or organomegaly, BS  present. Ext: No LE edema bilaterally. 2+DP/PT pulse bilaterally. Musculoskeletal: No gross deformity, no red, hot, swollen joints, no limitation in ROM  Skin: No rashes or wounds on exposed surfaces  Neuro: Alert, non-verbal, cranial nerves II-XII grossly intact. No focal findings Psych: Patient is not overtly psychotic, calm, cooperative.  Labs on Admission:  Basic Metabolic Panel:  Recent Labs Lab 03/20/15 1545  NA 137  K 3.7  CL 101  CO2 26  GLUCOSE 122*  BUN 15  CREATININE 1.21  CALCIUM 8.7*   Liver Function Tests:  Recent Labs Lab 03/20/15 1545  AST 17  ALT 18  ALKPHOS 63  BILITOT 0.8  PROT 7.5  ALBUMIN 3.9    Recent Labs Lab 03/20/15 1545  LIPASE 14   No results for input(s): AMMONIA in the last 168 hours. CBC:  Recent Labs Lab 03/20/15 1545  WBC 20.1*  HGB 13.1  HCT 39.5  MCV 94.5  PLT 297   Cardiac Enzymes: No results for input(s): CKTOTAL, CKMB, CKMBINDEX, TROPONINI in the last 168 hours.  BNP (last 3 results) No results for input(s): BNP in the last 8760 hours.  ProBNP (last 3 results) No results for input(s): PROBNP in the last 8760 hours.  CBG: No results for input(s): GLUCAP in the last 168 hours.  Radiological Exams on Admission: Dg Chest 2 View  03/20/2015  CLINICAL DATA:  Cough for 7 weeks EXAM: CHEST  2 VIEW COMPARISON:  08/25/2013 FINDINGS: Cardiomediastinal silhouette is stable. No acute infiltrate or pleural effusion. No pulmonary edema. Mild infrahilar bronchitic changes. Bony thorax is stable. IMPRESSION: No acute infiltrate or pulmonary edema. Mild infrahilar bronchitic changes. Electronically Signed   By: Natasha Mead M.D.   On: 03/20/2015 15:36   Ct Renal Stone Study  03/20/2015  CLINICAL DATA:  30 year old with hematuria today. Fever. On antibiotics for cough. EXAM: CT ABDOMEN AND PELVIS WITHOUT CONTRAST TECHNIQUE: Multidetector CT imaging of the abdomen and pelvis was performed following the standard protocol without IV  contrast. COMPARISON:  Acute abdominal series done 08/25/2013. Abdominal CT 03/31/2010. FINDINGS: Lower chest: Clear lung bases. No significant pleural or pericardial effusion. Hepatobiliary: As evaluated in the noncontrast state, the liver appears normal without focal abnormality. No evidence of gallstones, gallbladder wall thickening or biliary dilatation. Pancreas: Unremarkable. No pancreatic ductal dilatation or surrounding inflammatory changes. Spleen: Normal in size without focal abnormality. Adrenals/Urinary Tract: Both adrenal glands appear normal. No evidence of urinary tract calculus or hydronephrosis. As evaluated in the noncontrast state, both kidneys appear unremarkable. There is moderate nonspecific bladder wall thickening. Stomach/Bowel: No evidence of bowel wall thickening, distention or surrounding inflammatory change. Vascular/Lymphatic: There are no enlarged abdominal or pelvic lymph nodes. No significant vascular findings on noncontrast imaging. Reproductive: Unremarkable. Other: Stable small umbilical hernia containing only fat. Musculoskeletal: No acute or significant osseous findings. IMPRESSION: 1. Nonspecific bladder wall thickening, likely in part due to incomplete distention. Correlation with urine analysis recommended to exclude cystitis. 2. No evidence of urinary tract calculus or  hydronephrosis. 3. No other significant findings. Electronically Signed   By: Carey Bullocks M.D.   On: 03/20/2015 17:32    EKG:  Not done in ED, will obtain as appropriate   Assessment/Plan  1. Sepsis secondary to acute cystitis  - Presents with sinus tachycardia and leukocytosis; UA suggestive of infection, CT supports this with notation of bladder wall thickening  - Tachycardia resolved with NS bolus in ED, continuing IVF with NS at 100 cc/hr  - Empiric Rocephin started while awaiting return of urine culture; blood cultures obtained  - Initial lactate normal  - Monitor on telemetry   2. Cough,  post-tussive vomiting  - CXR clear, breathing unlabored, saturating well on rm air  - Present intermittently for last 6 wks  - Uncertain etio, possibly viral URI, GERD, asthma, post-nasal drip  - Monitoring     DVT ppx:   SQ Lovenox    Code Status: Full code Family Communication:  Yes, patient's mother and father at bed side Disposition Plan: Admit to inpatient   Date of Service 03/20/2015    Briscoe Deutscher, MD [facility] Pager [phone]  If 7PM-7AM, please contact night-coverage [email or web address] Password [facility] 03/20/2015, 7:54 PM

## 2015-03-20 NOTE — Progress Notes (Signed)
ANTIBIOTIC CONSULT NOTE - INITIAL  Pharmacy Consult for Ceftriaxone Indication: UTI  No Known Allergies  Patient Measurements:    Vital Signs: Temp: 99 F (37.2 C) (01/18 1451) Temp Source: Oral (01/18 1451) BP: 130/74 mmHg (01/18 1715) Pulse Rate: 90 (01/18 1715) Intake/Output from previous day:    Labs:  Recent Labs  03/20/15 1545  WBC 20.1*  HGB 13.1  PLT 297  CREATININE 1.21   CrCl cannot be calculated (Unknown ideal weight.). No results for input(s): VANCOTROUGH, VANCOPEAK, VANCORANDOM, GENTTROUGH, GENTPEAK, GENTRANDOM, TOBRATROUGH, TOBRAPEAK, TOBRARND, AMIKACINPEAK, AMIKACINTROU, AMIKACIN in the last 72 hours.   Microbiology: No results found for this or any previous visit (from the past 720 hour(s)).  Medical History: Past Medical History  Diagnosis Date  . Down syndrome     Medications:  Anti-infectives    Start     Dose/Rate Route Frequency Ordered Stop   03/20/15 2000  cefTRIAXone (ROCEPHIN) 2 g in dextrose 5 % 50 mL IVPB     2 g 100 mL/hr over 30 Minutes Intravenous  Once 03/20/15 1954     03/20/15 1715  levofloxacin (LEVAQUIN) IVPB 750 mg     750 mg 100 mL/hr over 90 Minutes Intravenous  Once 03/20/15 1707 03/20/15 1925     Assessment: 29 yoM presented to ED on 1/18 with emesis, fever, cough, myalgia, and reported blood in urine.  He recently finished a course of outpatient amoxicillin.  Pharmacy is consulted to dose ceftriaxone for UTI.  Today, 03/20/2015: Tmax 99 WBC 20.1 SCr 1.21  Antimicrobials this admission: 1/18 >> Levaquin x1 only 1/18 >> Ceftriaxone >>  Levels/dose changes this admission:  Microbiology Results: 1/18 BCx: sent 1/18 UCx: sent    Goal of Therapy:  Appropriate abx dosing, eradication of infection.   Plan:  Ceftriaxone 1g IV q24h Dosage remains stable and need for further dosage adjustment appears unlikely at present.  Pharmacy wll sign off at this time.  Please reconsult if a change in clinical status  warrants re-evaluation of dosage.  Lynann Beaver PharmD, BCPS Pager [phone] 03/20/2015 7:59 PM

## 2015-03-20 NOTE — ED Notes (Signed)
No need to collect lactic acid

## 2015-03-20 NOTE — ED Provider Notes (Signed)
CSN: [id number]     Arrival date & time 03/20/15  1438 History   First MD Initiated Contact with Patient 03/20/15 1552     Chief Complaint  Patient presents with  . Emesis     (Consider location/radiation/quality/duration/timing/severity/associated sxs/prior Treatment) HPI Patient has had several episodes of illness in the past 6 weeks. 6 weeks ago he developed fevers, cough and myalgia. After several weeks he was treated with amoxicillin. He is nearing the end of the course of amoxicillin. He was improving, but now he has again developed a harsh coughing productive of mucus. His mother is also noted blood in his urine. He has some endorsement of abdominal pain. He indicates his abdomen more generally but somewhat epigastric and central. The parents report he has not had shortness of breath. Vomiting has predominantly been posttussive in nature mucus and some emesis. Patient does not endorse chest pain. They deny there been any other rashes, joint swelling or areas of skin redness. The patient does have Down syndrome but is otherwise healthy. Past Medical History  Diagnosis Date  . Down syndrome    Past Surgical History  Procedure Laterality Date  . Myringotomy     Family History  Problem Relation Age of Onset  . Hypertension Mother   . Cancer Mother    Social History  Substance Use Topics  . Smoking status: Never Smoker   . Smokeless tobacco: None  . Alcohol Use: No    Review of Systems  10 Systems reviewed and are negative for acute change except as noted in the HPI. Review systems is performed with parents however patient does nod in agreement and expresses input.  Allergies  Review of patient's allergies indicates no known allergies.  Home Medications   Prior to Admission medications   Medication Sig Start Date End Date Taking? Authorizing Provider  azithromycin (ZITHROMAX) 250 MG tablet Take 1 tablet (250 mg total) by mouth daily. Take first 2 tablets together, then 1  every day until finished. Patient not taking: Reported on 03/20/2015 09/26/14   Eyvonne Mechanic, PA-C  fluticasone [facility]) 50 MCG/ACT nasal spray Place 2 sprays into both nostrils daily. Patient not taking: Reported on 03/20/2015 09/26/14   Eyvonne Mechanic, PA-C  guaiFENesin-dextromethorphan (ROBITUSSIN DM) 100-10 MG/5ML syrup Take 5 mLs by mouth every 4 (four) hours as needed for cough. Patient not taking: Reported on 03/20/2015 09/26/14   Eyvonne Mechanic, PA-C  ibuprofen (ADVIL,MOTRIN) 200 MG tablet Take 200 mg by mouth every 6 (six) hours as needed (pain).    Historical Provider, MD  loratadine (CLARITIN) 10 MG tablet Take 1 tablet (10 mg total) by mouth daily. Patient not taking: Reported on 03/20/2015 09/26/14   Eyvonne Mechanic, PA-C   BP 130/74 mmHg  Pulse 90  Temp(Src) 99 F (37.2 C) (Oral)  Resp 17  SpO2 97% Physical Exam  Constitutional: He appears well-developed and well-nourished.  Patient has a facies of Down syndrome but is alert and nontoxic. He does not have acute respiratory distress.  HENT:  Head: Normocephalic and atraumatic.  Right Ear: External ear normal.  Left Ear: External ear normal.  Nose: Nose normal.  Mouth/Throat: Oropharynx is clear and moist.  Eyes: EOM are normal. Pupils are equal, round, and reactive to light.  Neck: Neck supple.  Cardiovascular: Normal rate, regular rhythm, normal heart sounds and intact distal pulses.   Pulmonary/Chest: Effort normal and breath sounds normal.  Abdominal: Soft. Bowel sounds are normal. He exhibits no distension. There is tenderness.  Patient endorses  some tenderness which is difficult to localize. Predominantly central and somewhat right. There is no guarding.  Genitourinary:  Penis is normal. No blood present at this time.  Musculoskeletal: Normal range of motion. He exhibits no edema or tenderness.  Neurological: He is alert. He has normal strength. He exhibits normal muscle tone. Coordination normal. GCS eye subscore is 4.  GCS verbal subscore is 5. GCS motor subscore is 6.  Patient is cooperative and responsive to verbal commands. No focal deficits to exam.  Skin: Skin is warm, dry and intact.  Psychiatric: He has a normal mood and affect.    ED Course  Procedures (including critical care time) Labs Review Labs Reviewed  COMPREHENSIVE METABOLIC PANEL - Abnormal; Notable for the following:    Glucose, Bld 122 (*)    Calcium 8.7 (*)    All other components within normal limits  CBC - Abnormal; Notable for the following:    WBC 20.1 (*)    RBC 4.18 (*)    All other components within normal limits  URINALYSIS, ROUTINE W REFLEX MICROSCOPIC (NOT AT [facility]) - Abnormal; Notable for the following:    APPearance CLOUDY (*)    Hgb urine dipstick MODERATE (*)    Bilirubin Urine SMALL (*)    Protein, ur 30 (*)    Leukocytes, UA SMALL (*)    All other components within normal limits  URINE MICROSCOPIC-ADD ON - Abnormal; Notable for the following:    Squamous Epithelial / LPF 6-30 (*)    Bacteria, UA MANY (*)    All other components within normal limits  URINE CULTURE  CULTURE, BLOOD (ROUTINE X 2)  CULTURE, BLOOD (ROUTINE X 2)  LIPASE, BLOOD  I-STAT CG4 LACTIC ACID, ED  I-STAT CG4 LACTIC ACID, ED    Imaging Review Dg Chest 2 View  03/20/2015  CLINICAL DATA:  Cough for 7 weeks EXAM: CHEST  2 VIEW COMPARISON:  08/25/2013 FINDINGS: Cardiomediastinal silhouette is stable. No acute infiltrate or pleural effusion. No pulmonary edema. Mild infrahilar bronchitic changes. Bony thorax is stable. IMPRESSION: No acute infiltrate or pulmonary edema. Mild infrahilar bronchitic changes. Electronically Signed   By: Natasha Mead M.D.   On: 03/20/2015 15:36   Ct Renal Stone Study  03/20/2015  CLINICAL DATA:  31 year old with hematuria today. Fever. On antibiotics for cough. EXAM: CT ABDOMEN AND PELVIS WITHOUT CONTRAST TECHNIQUE: Multidetector CT imaging of the abdomen and pelvis was performed following the standard protocol  without IV contrast. COMPARISON:  Acute abdominal series done 08/25/2013. Abdominal CT 03/31/2010. FINDINGS: Lower chest: Clear lung bases. No significant pleural or pericardial effusion. Hepatobiliary: As evaluated in the noncontrast state, the liver appears normal without focal abnormality. No evidence of gallstones, gallbladder wall thickening or biliary dilatation. Pancreas: Unremarkable. No pancreatic ductal dilatation or surrounding inflammatory changes. Spleen: Normal in size without focal abnormality. Adrenals/Urinary Tract: Both adrenal glands appear normal. No evidence of urinary tract calculus or hydronephrosis. As evaluated in the noncontrast state, both kidneys appear unremarkable. There is moderate nonspecific bladder wall thickening. Stomach/Bowel: No evidence of bowel wall thickening, distention or surrounding inflammatory change. Vascular/Lymphatic: There are no enlarged abdominal or pelvic lymph nodes. No significant vascular findings on noncontrast imaging. Reproductive: Unremarkable. Other: Stable small umbilical hernia containing only fat. Musculoskeletal: No acute or significant osseous findings. IMPRESSION: 1. Nonspecific bladder wall thickening, likely in part due to incomplete distention. Correlation with urine analysis recommended to exclude cystitis. 2. No evidence of urinary tract calculus or hydronephrosis. 3. No other significant findings.  Electronically Signed   By: Carey Bullocks M.D.   On: 03/20/2015 17:32   I have personally reviewed and evaluated these images and lab results as part of my medical decision-making.   EKG Interpretation None     Consult: Patient's case was reviewed with hospitalist for admission. MDM   Final diagnoses:  UTI (lower urinary tract infection)  Leukocytosis  Cough  Non-intractable vomiting without nausea, vomiting of unspecified type   Patient presents with worsening illness despite outpatient treatment with antibiotics. Patient is been  on amoxicillin for respiratory illness. He has had worsening condition since yesterday with increasing cough and mother noting blood in his urine. Patient has urine with large leukocytosis as well as bacteria. Patient has significant leukocytosis on CBC. He has had severe amount coughing and posttussive emesis as well at this time patient will be admitted for IV therapy, presenting with tachycardia and hypotension concerning for possible early sepsis. Patient has responded well to fluid hydration and antibiotics have been initiated. His mental status is clear. He does not have respiratory distress.    Arby Barrette, MD 03/20/15 [phone]

## 2015-03-21 DIAGNOSIS — A419 Sepsis, unspecified organism: Principal | ICD-10-CM

## 2015-03-21 DIAGNOSIS — Z79899 Other long term (current) drug therapy: Secondary | ICD-10-CM | POA: Diagnosis not present

## 2015-03-21 DIAGNOSIS — N390 Urinary tract infection, site not specified: Secondary | ICD-10-CM

## 2015-03-21 DIAGNOSIS — N3001 Acute cystitis with hematuria: Secondary | ICD-10-CM | POA: Diagnosis present

## 2015-03-21 DIAGNOSIS — R509 Fever, unspecified: Secondary | ICD-10-CM | POA: Diagnosis present

## 2015-03-21 DIAGNOSIS — N3090 Cystitis, unspecified without hematuria: Secondary | ICD-10-CM

## 2015-03-21 DIAGNOSIS — Q909 Down syndrome, unspecified: Secondary | ICD-10-CM | POA: Diagnosis not present

## 2015-03-21 LAB — COMPREHENSIVE METABOLIC PANEL
ALBUMIN: 3.2 g/dL — AB (ref 3.5–5.0)
ALT: 15 U/L — AB (ref 17–63)
ANION GAP: 8 (ref 5–15)
AST: 15 U/L (ref 15–41)
Alkaline Phosphatase: 47 U/L (ref 38–126)
BILIRUBIN TOTAL: 0.7 mg/dL (ref 0.3–1.2)
BUN: 12 mg/dL (ref 6–20)
CHLORIDE: 109 mmol/L (ref 101–111)
CO2: 24 mmol/L (ref 22–32)
Calcium: 8.7 mg/dL — ABNORMAL LOW (ref 8.9–10.3)
Creatinine, Ser: 1.02 mg/dL (ref 0.61–1.24)
Glucose, Bld: 94 mg/dL (ref 65–99)
POTASSIUM: 4.3 mmol/L (ref 3.5–5.1)
SODIUM: 141 mmol/L (ref 135–145)
TOTAL PROTEIN: 6.3 g/dL — AB (ref 6.5–8.1)

## 2015-03-21 LAB — CBC WITH DIFFERENTIAL/PLATELET
BASOS ABS: 0 10*3/uL (ref 0.0–0.1)
BASOS PCT: 0 %
EOS ABS: 0 10*3/uL (ref 0.0–0.7)
Eosinophils Relative: 0 %
HCT: 36 % — ABNORMAL LOW (ref 39.0–52.0)
HEMOGLOBIN: 11.9 g/dL — AB (ref 13.0–17.0)
Lymphocytes Relative: 9 %
Lymphs Abs: 1.3 10*3/uL (ref 0.7–4.0)
MCH: 31.8 pg (ref 26.0–34.0)
MCHC: 33.1 g/dL (ref 30.0–36.0)
MCV: 96.3 fL (ref 78.0–100.0)
Monocytes Absolute: 0.9 10*3/uL (ref 0.1–1.0)
Monocytes Relative: 6 %
NEUTROS PCT: 85 %
Neutro Abs: 12.5 10*3/uL — ABNORMAL HIGH (ref 1.7–7.7)
Platelets: 247 10*3/uL (ref 150–400)
RBC: 3.74 MIL/uL — AB (ref 4.22–5.81)
RDW: 14.4 % (ref 11.5–15.5)
WBC: 14.8 10*3/uL — AB (ref 4.0–10.5)

## 2015-03-21 LAB — CBG MONITORING, ED: Glucose-Capillary: 89 mg/dL (ref 65–99)

## 2015-03-21 LAB — HEMOGLOBIN A1C
HEMOGLOBIN A1C: 5.5 % (ref 4.8–5.6)
MEAN PLASMA GLUCOSE: 111 mg/dL

## 2015-03-21 MED ORDER — DEXTROSE 5 % IV SOLN
1.0000 g | INTRAVENOUS | Status: DC
  Administered 2015-03-21: 1 g via INTRAVENOUS
  Filled 2015-03-21 (×2): qty 10

## 2015-03-21 MED ORDER — SODIUM CHLORIDE 0.9 % IV SOLN
INTRAVENOUS | Status: DC
  Administered 2015-03-21: 15:00:00 via INTRAVENOUS

## 2015-03-21 MED ORDER — POLYETHYLENE GLYCOL 3350 17 G PO PACK
17.0000 g | PACK | Freq: Every day | ORAL | Status: DC
  Administered 2015-03-22: 17 g via ORAL
  Filled 2015-03-21 (×2): qty 1

## 2015-03-21 MED ORDER — ENOXAPARIN SODIUM 40 MG/0.4ML ~~LOC~~ SOLN
40.0000 mg | SUBCUTANEOUS | Status: DC
  Administered 2015-03-21 – 2015-03-22 (×2): 40 mg via SUBCUTANEOUS
  Filled 2015-03-21 (×2): qty 0.4

## 2015-03-21 NOTE — Plan of Care (Signed)
Problem: Education: Goal: Knowledge of treatment and prevention of UTI/Pyleonephritis will improve Outcome: Completed/Met Date Met:  03/21/15 Family at bedside, mother and aunt

## 2015-03-21 NOTE — Progress Notes (Signed)
Bladder scan completed, 280 cc. Patient was encouraged to void, U/O 300 cc.

## 2015-03-21 NOTE — Progress Notes (Signed)
[facility] PROGRESS NOTE  GEOVANIE WINNETT Powell:[id number] DOB: 1985-03-15 DOA: 03/20/2015 PCP: Jamal Collin, PA-C  Assessment/Plan: 1. Sepsis secondary to acute cystitis  - Presents with sinus tachycardia and leukocytosis; UA suggestive of infection, CT supports this with notation of bladder wall thickening  - Continue with ceftriaxone.  -urine culture growing E coli - Initial lactate normal   2. Cough, post-tussive vomiting  - CXR clear.  - Present intermittently for last 6 wks  - Uncertain etio, possibly viral URI, GERD, asthma, post-nasal drip  - Monitoring  -swallow evaluation.   Code Status: Full Code Family Communication: aunt who was at bedside.  Disposition Plan: home in 24 hours, when sensitivity and cultures available.    Consultants:  none  Procedures:  none  Antibiotics:  ceftriaxone  HPI/Subjective: Patient with down syndrome, he is able to communicate with family with signs.  Per aunt who was at bedside patient appears better, better spirit.  He has been able to eat.   Objective: Filed Vitals:   03/21/15 1600 03/21/15 1625  BP: 107/65 121/65  Pulse: 80 87  Temp:  99.9 F (37.7 C)  Resp: 14 20    Intake/Output Summary (Last 24 hours) at 03/21/15 1714 Last data filed at 03/20/15 2109  Gross per 24 hour  Intake      3 ml  Output      0 ml  Net      3 ml   Filed Weights   03/20/15 2134 03/21/15 1625  Weight: 75.297 kg (166 lb) 75.297 kg (166 lb)    Exam:   General:  NAD  Cardiovascular: S 1, S 2 RRR  Respiratory: CTA  Abdomen: BS present, soft, nt  Musculoskeletal: no edema  Data Reviewed: Basic Metabolic Panel:  Recent Labs Lab 03/20/15 1545 03/21/15 0653  NA 137 141  K 3.7 4.3  CL 101 109  CO2 26 24  GLUCOSE 122* 94  BUN 15 12  CREATININE 1.21 1.02  CALCIUM 8.7* 8.7*   Liver Function Tests:  Recent Labs Lab 03/20/15 1545 03/21/15 0653  AST 17 15  ALT 18 15*  ALKPHOS 63 47  BILITOT 0.8  0.7  PROT 7.5 6.3*  ALBUMIN 3.9 3.2*    Recent Labs Lab 03/20/15 1545  LIPASE 14   No results for input(s): AMMONIA in the last 168 hours. CBC:  Recent Labs Lab 03/20/15 1545 03/21/15 0653  WBC 20.1* 14.8*  NEUTROABS  --  12.5*  HGB 13.1 11.9*  HCT 39.5 36.0*  MCV 94.5 96.3  PLT 297 247   Cardiac Enzymes: No results for input(s): CKTOTAL, CKMB, CKMBINDEX, TROPONINI in the last 168 hours. BNP (last 3 results)  Recent Labs  03/20/15 2024  BNP 43.7    ProBNP (last 3 results) No results for input(s): PROBNP in the last 8760 hours.  CBG:  Recent Labs Lab 03/21/15 0818  GLUCAP 89    Recent Results (from the past 240 hour(s))  Urine culture     Status: None (Preliminary result)   Collection Time: 03/20/15  3:54 PM  Result Value Ref Range Status   Specimen Description URINE, RANDOM  Final   Special Requests NONE  Final   Culture   Final    >=100,000 COLONIES/mL ESCHERICHIA COLI Performed at [facility]    Report Status PENDING  Incomplete  Culture, blood (routine x 2)     Status: None (Preliminary result)   Collection Time: 03/20/15  6:05 PM  Result Value Ref Range Status  Specimen Description BLOOD RIGHT HAND  Final   Special Requests IN PEDIATRIC BOTTLE 8CC  Final   Culture   Final    NO GROWTH < 24 HOURS Performed at [facility]    Report Status PENDING  Incomplete  Culture, blood (routine x 2)     Status: None (Preliminary result)   Collection Time: 03/20/15  6:28 PM  Result Value Ref Range Status   Specimen Description BLOOD LEFT HAND  Final   Special Requests BOTTLES DRAWN AEROBIC AND ANAEROBIC 5CC  Final   Culture   Final    NO GROWTH < 24 HOURS Performed at [facility]    Report Status PENDING  Incomplete     Studies: Dg Chest 2 View  03/20/2015  CLINICAL DATA:  Cough for 7 weeks EXAM: CHEST  2 VIEW COMPARISON:  08/25/2013 FINDINGS: Cardiomediastinal silhouette is stable. No acute infiltrate or pleural effusion.  No pulmonary edema. Mild infrahilar bronchitic changes. Bony thorax is stable. IMPRESSION: No acute infiltrate or pulmonary edema. Mild infrahilar bronchitic changes. Electronically Signed   By: Natasha Mead M.D.   On: 03/20/2015 15:36   Ct Renal Stone Study  03/20/2015  CLINICAL DATA:  30 year old with hematuria today. Fever. On antibiotics for cough. EXAM: CT ABDOMEN AND PELVIS WITHOUT CONTRAST TECHNIQUE: Multidetector CT imaging of the abdomen and pelvis was performed following the standard protocol without IV contrast. COMPARISON:  Acute abdominal series done 08/25/2013. Abdominal CT 03/31/2010. FINDINGS: Lower chest: Clear lung bases. No significant pleural or pericardial effusion. Hepatobiliary: As evaluated in the noncontrast state, the liver appears normal without focal abnormality. No evidence of gallstones, gallbladder wall thickening or biliary dilatation. Pancreas: Unremarkable. No pancreatic ductal dilatation or surrounding inflammatory changes. Spleen: Normal in size without focal abnormality. Adrenals/Urinary Tract: Both adrenal glands appear normal. No evidence of urinary tract calculus or hydronephrosis. As evaluated in the noncontrast state, both kidneys appear unremarkable. There is moderate nonspecific bladder wall thickening. Stomach/Bowel: No evidence of bowel wall thickening, distention or surrounding inflammatory change. Vascular/Lymphatic: There are no enlarged abdominal or pelvic lymph nodes. No significant vascular findings on noncontrast imaging. Reproductive: Unremarkable. Other: Stable small umbilical hernia containing only fat. Musculoskeletal: No acute or significant osseous findings. IMPRESSION: 1. Nonspecific bladder wall thickening, likely in part due to incomplete distention. Correlation with urine analysis recommended to exclude cystitis. 2. No evidence of urinary tract calculus or hydronephrosis. 3. No other significant findings. Electronically Signed   By: Carey Bullocks M.D.    On: 03/20/2015 17:32    Scheduled Meds: . cefTRIAXone (ROCEPHIN)  IV  1 g Intravenous Q24H  . enoxaparin (LOVENOX) injection  40 mg Subcutaneous Q24H  . sodium chloride  3 mL Intravenous Q12H   Continuous Infusions: . sodium chloride 75 mL/hr at 03/21/15 1527    Principal Problem:   Sepsis, unspecified organism (HCC) Active Problems:   Vomiting   Cystitis   UTI (lower urinary tract infection)    Time spent: 25 minutes.     Hartley Barefoot A  [facility] Pager [phone]. If 7PM-7AM, please contact night-coverage at [email or web address], password [facility] 03/21/2015, 5:14 PM

## 2015-03-21 NOTE — ED Notes (Signed)
HOSPITAL BED GIVEN FOR COMFORT.

## 2015-03-22 LAB — URINE CULTURE: Culture: >=100000

## 2015-03-22 LAB — CBC
HEMATOCRIT: 35 % — AB (ref 39.0–52.0)
Hemoglobin: 11.4 g/dL — ABNORMAL LOW (ref 13.0–17.0)
MCH: 31.5 pg (ref 26.0–34.0)
MCHC: 32.6 g/dL (ref 30.0–36.0)
MCV: 96.7 fL (ref 78.0–100.0)
PLATELETS: 272 10*3/uL (ref 150–400)
RBC: 3.62 MIL/uL — ABNORMAL LOW (ref 4.22–5.81)
RDW: 14.5 % (ref 11.5–15.5)
WBC: 10.1 10*3/uL (ref 4.0–10.5)

## 2015-03-22 LAB — GLUCOSE, CAPILLARY: GLUCOSE-CAPILLARY: 108 mg/dL — AB (ref 65–99)

## 2015-03-22 MED ORDER — ACETAMINOPHEN 325 MG PO TABS: 650.0000 mg | ORAL_TABLET | Freq: Four times a day (QID) | ORAL | Status: DC | PRN

## 2015-03-22 MED ORDER — CIPROFLOXACIN HCL 500 MG PO TABS: 500.0000 mg | ORAL_TABLET | Freq: Two times a day (BID) | ORAL | Status: DC

## 2015-03-22 NOTE — Progress Notes (Signed)
Patient's mother verbalized understanding of discharge instructions. Patient is stable at discharge.

## 2015-03-22 NOTE — Evaluation (Signed)
Clinical/Bedside Swallow Evaluation Patient Details  Name: KEAGEN HEINLEN MRN: [id number] Date of Birth: [birth year]  Today's Date: 03/22/2015 Time: SLP Start Time (ACUTE ONLY): 1020 SLP Stop Time (ACUTE ONLY): 1050 SLP Time Calculation (min) (ACUTE ONLY): 30 min  Past Medical History:  Past Medical History  Diagnosis Date  . Down syndrome    Past Surgical History:  Past Surgical History  Procedure Laterality Date  . Myringotomy    . 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   Assessment / Plan / Recommendation Clinical Impression  Patient presents with a normal oropharyngeal swallow as per this BSE. He did not exhibit any overt s/s of aspiration or difficulties any any phase of the swallow with bolues of thin liquids via straw and cup, puree solids and regular/hard solids. Suspect that patient is experiencing some GERD symptoms, otherwise this may be just inflammation and generalized irritation of the pharynx.    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 Type of Study: Bedside Swallow Evaluation Previous Swallow Assessment: N/A Diet Prior to  this Study: Regular;Thin liquids Temperature Spikes Noted: N/A Respiratory Status: Room air History of Recent Intubation: No Behavior/Cognition: Alert;Cooperative;Pleasant mood Oral Cavity Assessment: Within Functional Limits Oral Care Completed by SLP: No Oral Cavity - Dentition: Adequate natural dentition Vision: Functional for self-feeding Self-Feeding Abilities: Needs assist;Able to feed self Patient Positioning: Upright in bed Baseline Vocal Quality: Not observed (patient is non-verbal) Volitional Cough: Strong Volitional Swallow: Unable to elicit    Oral/Motor/Sensory Function Overall Oral Motor/Sensory Function: Within functional limits   Ice Chips     Thin Liquid Thin Liquid: Within functional limits Presentation: Cup;Self Fed;Straw    Nectar Thick     Honey Thick     Puree Puree: Within functional limits Presentation: Spoon   Solid   GO   Solid: Within functional limits Presentation: Self Wilber Bihari, Kaity Pitstick Tarrell 03/22/2015,2:45 PM  Angela Nevin, MA, CCC-SLP 03/22/2015 2:45 PM

## 2015-03-22 NOTE — Discharge Summary (Signed)
Physician Discharge Summary  Levi Powell ZOX:[id number] DOB: Oct 02, 1985 DOA: 03/20/2015  PCP: Jamal Collin, PA-C  Admit date: 03/20/2015 Discharge date: 03/22/2015  Time spent: 35 minutes  Recommendations for Outpatient Follow-up:  1. Repeat UA to document resolution of hematuria and infection   Discharge Diagnoses:    Sepsis, unspecified organism (HCC)   Vomiting   Cystitis   UTI (lower urinary tract infection)   Discharge Condition: stable.   Diet recommendation: regular diet   Filed Weights   03/20/15 2134 03/21/15 1625  Weight: 75.297 kg (166 lb) 75.297 kg (166 lb)    History of present illness:  Levi Powell is a 30 y.o. 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 Approximately 6 weeks ago, the patient developed a fever and cough, eventually saw his PCP, and was prescribed a course of amoxicillin. There was initial improvement and the patient defervesced, but over the last week he had been having increasingly fatigued, and on the morning of his admission his mother noted frank blood in his urine. The patient has had no history of kidney stones or UTI, and has never had gross hematuria previously.  In ED, patient was found to be afebrile, saturating well on room air, with heart rate initially in the 120s and blood pressure is marginal. A CT of the abdomen was obtained, demonstrating urinary bladder wall thickening, but no stones. Blood work returned with a leukocytosis greater than 20,000 and urinalysis features many bacteria and small leukocytes. Patient received a 1 L bolus of normal saline with subsequent improvement in tachycardia. An empiric dose of IV Levaquin was given for UTI and the patient was admitted to the hospital for ongoing evaluation and management of sepsis secondary to UTI.  Hospital Course:  1. Sepsis secondary  to acute cystitis  - Presents with sinus tachycardia and leukocytosis; UA suggestive of infection, CT supports this with notation of bladder wall thickening  - received ceftriaxone for 2 days. E coli sensitive to cipro. Discharge on ciprofloxacin for 5 days  -urine culture growing E coli - Initial lactate normal   2. Cough, post-tussive vomiting  - CXR clear. resolved.  - Present intermittently for last 6 wks  - Uncertain etio, possibly viral URI, GERD, asthma, post-nasal drip  - Monitoring    Procedures:  none  Consultations:  none  Discharge Exam: Filed Vitals:   03/21/15 2146 03/22/15 0522  BP: 114/56 114/57  Pulse: 92 77  Temp: 98.4 F (36.9 C) 97.9 F (36.6 C)  Resp: 18 18    General: NAD Cardiovascular: S 1, S 2 RRR Respiratory: CTA  Discharge Instructions   Discharge Instructions    Diet - low sodium heart healthy    Complete by:  As directed      Increase activity slowly    Complete by:  As directed           Current Discharge Medication List    START taking these medications   Details  acetaminophen (TYLENOL) 325 MG tablet Take 2 tablets (650 mg total) by mouth every 6 (six) hours as needed for mild pain (or Fever >/= 101). Qty: 30 tablet, Refills: 0    ciprofloxacin (CIPRO) 500 MG tablet Take 1 tablet (500 mg total) by mouth 2 (two) times daily. Qty: 10 tablet, Refills: 0      STOP taking these medications  azithromycin (ZITHROMAX) 250 MG tablet      fluticasone (FLONASE) 50 MCG/ACT nasal spray      guaiFENesin-dextromethorphan (ROBITUSSIN DM) 100-10 MG/5ML syrup      ibuprofen (ADVIL,MOTRIN) 200 MG tablet      loratadine (CLARITIN) 10 MG tablet        No Known Allergies Follow-up Information    Follow up with HEDGECOCK,SUZANNE, PA-C In 1 week.   Specialty:  Physician Assistant   Contact information:   [address] [facility] [address] [phone]        The results of significant  diagnostics from this hospitalization (including imaging, microbiology, ancillary and laboratory) are listed below for reference.    Significant Diagnostic Studies: Dg Chest 2 View  03/20/2015  CLINICAL DATA:  Cough for 7 weeks EXAM: CHEST  2 VIEW COMPARISON:  08/25/2013 FINDINGS: Cardiomediastinal silhouette is stable. No acute infiltrate or pleural effusion. No pulmonary edema. Mild infrahilar bronchitic changes. Bony thorax is stable. IMPRESSION: No acute infiltrate or pulmonary edema. Mild infrahilar bronchitic changes. Electronically Signed   By: Natasha Mead M.D.   On: 03/20/2015 15:36   Ct Renal Stone Study  03/20/2015  CLINICAL DATA:  30 year old with hematuria today. Fever. On antibiotics for cough. EXAM: CT ABDOMEN AND PELVIS WITHOUT CONTRAST TECHNIQUE: Multidetector CT imaging of the abdomen and pelvis was performed following the standard protocol without IV contrast. COMPARISON:  Acute abdominal series done 08/25/2013. Abdominal CT 03/31/2010. FINDINGS: Lower chest: Clear lung bases. No significant pleural or pericardial effusion. Hepatobiliary: As evaluated in the noncontrast state, the liver appears normal without focal abnormality. No evidence of gallstones, gallbladder wall thickening or biliary dilatation. Pancreas: Unremarkable. No pancreatic ductal dilatation or surrounding inflammatory changes. Spleen: Normal in size without focal abnormality. Adrenals/Urinary Tract: Both adrenal glands appear normal. No evidence of urinary tract calculus or hydronephrosis. As evaluated in the noncontrast state, both kidneys appear unremarkable. There is moderate nonspecific bladder wall thickening. Stomach/Bowel: No evidence of bowel wall thickening, distention or surrounding inflammatory change. Vascular/Lymphatic: There are no enlarged abdominal or pelvic lymph nodes. No significant vascular findings on noncontrast imaging. Reproductive: Unremarkable. Other: Stable small umbilical hernia containing only  fat. Musculoskeletal: No acute or significant osseous findings. IMPRESSION: 1. Nonspecific bladder wall thickening, likely in part due to incomplete distention. Correlation with urine analysis recommended to exclude cystitis. 2. No evidence of urinary tract calculus or hydronephrosis. 3. No other significant findings. Electronically Signed   By: Carey Bullocks M.D.   On: 03/20/2015 17:32    Microbiology: Recent Results (from the past 240 hour(s))  Urine culture     Status: None   Collection Time: 03/20/15  3:54 PM  Result Value Ref Range Status   Specimen Description URINE, RANDOM  Final   Special Requests NONE  Final   Culture   Final    >=100,000 COLONIES/mL ESCHERICHIA COLI Performed at [facility]    Report Status 03/22/2015 FINAL  Final   Organism ID, Bacteria ESCHERICHIA COLI  Final      Susceptibility   Escherichia coli - MIC*    AMPICILLIN >=32 RESISTANT Resistant     CEFAZOLIN <=4 SENSITIVE Sensitive     CEFTRIAXONE <=1 SENSITIVE Sensitive     CIPROFLOXACIN <=0.25 SENSITIVE Sensitive     GENTAMICIN <=1 SENSITIVE Sensitive     IMIPENEM <=0.25 SENSITIVE Sensitive     NITROFURANTOIN <=16 SENSITIVE Sensitive     TRIMETH/SULFA >=320 RESISTANT Resistant     AMPICILLIN/SULBACTAM >=32 RESISTANT Resistant  PIP/TAZO 16 SENSITIVE Sensitive     * >=100,000 COLONIES/mL ESCHERICHIA COLI  Culture, blood (routine x 2)     Status: None (Preliminary result)   Collection Time: 03/20/15  6:05 PM  Result Value Ref Range Status   Specimen Description BLOOD RIGHT HAND  Final   Special Requests IN PEDIATRIC BOTTLE 8CC  Final   Culture   Final    NO GROWTH < 24 HOURS Performed at [facility]    Report Status PENDING  Incomplete  Culture, blood (routine x 2)     Status: None (Preliminary result)   Collection Time: 03/20/15  6:28 PM  Result Value Ref Range Status   Specimen Description BLOOD LEFT HAND  Final   Special Requests BOTTLES DRAWN AEROBIC AND ANAEROBIC 5CC   Final   Culture   Final    NO GROWTH < 24 HOURS Performed at [facility]    Report Status PENDING  Incomplete     Labs: Basic Metabolic Panel:  Recent Labs Lab 03/20/15 1545 03/21/15 0653  NA 137 141  K 3.7 4.3  CL 101 109  CO2 26 24  GLUCOSE 122* 94  BUN 15 12  CREATININE 1.21 1.02  CALCIUM 8.7* 8.7*   Liver Function Tests:  Recent Labs Lab 03/20/15 1545 03/21/15 0653  AST 17 15  ALT 18 15*  ALKPHOS 63 47  BILITOT 0.8 0.7  PROT 7.5 6.3*  ALBUMIN 3.9 3.2*    Recent Labs Lab 03/20/15 1545  LIPASE 14   No results for input(s): AMMONIA in the last 168 hours. CBC:  Recent Labs Lab 03/20/15 1545 03/21/15 0653 03/22/15 0433  WBC 20.1* 14.8* 10.1  NEUTROABS  --  12.5*  --   HGB 13.1 11.9* 11.4*  HCT 39.5 36.0* 35.0*  MCV 94.5 96.3 96.7  PLT 297 247 272   Cardiac Enzymes: No results for input(s): CKTOTAL, CKMB, CKMBINDEX, TROPONINI in the last 168 hours. BNP: BNP (last 3 results)  Recent Labs  03/20/15 2024  BNP 43.7    ProBNP (last 3 results) No results for input(s): PROBNP in the last 8760 hours.  CBG:  Recent Labs Lab 03/21/15 0818 03/22/15 0741  GLUCAP 89 108*       Signed:  Hartley Barefoot A MD.  [facility] 03/22/2015, 9:25 AM

## 2015-03-25 LAB — CULTURE, BLOOD (ROUTINE X 2)
CULTURE: NO GROWTH
CULTURE: NO GROWTH

## 2015-04-29 ENCOUNTER — Observation Stay (HOSPITAL_COMMUNITY)
Admission: EM | Admit: 2015-04-29 | Discharge: 2015-05-01 | Disposition: A | Discharge status: Home / Self Care | Payer: Medicare Other | Attending: Internal Medicine | Admitting: Internal Medicine

## 2015-04-29 ENCOUNTER — Emergency Department (HOSPITAL_COMMUNITY): Payer: Medicare Other

## 2015-04-29 ENCOUNTER — Encounter (HOSPITAL_COMMUNITY): Payer: Self-pay | Admitting: *Deleted

## 2015-04-29 DIAGNOSIS — R197 Diarrhea, unspecified: Secondary | ICD-10-CM | POA: Diagnosis not present

## 2015-04-29 DIAGNOSIS — R05 Cough: Secondary | ICD-10-CM | POA: Insufficient documentation

## 2015-04-29 DIAGNOSIS — Z792 Long term (current) use of antibiotics: Secondary | ICD-10-CM | POA: Diagnosis not present

## 2015-04-29 DIAGNOSIS — A0811 Acute gastroenteropathy due to Norwalk agent: Secondary | ICD-10-CM

## 2015-04-29 DIAGNOSIS — E860 Dehydration: Secondary | ICD-10-CM | POA: Diagnosis not present

## 2015-04-29 DIAGNOSIS — R1110 Vomiting, unspecified: Secondary | ICD-10-CM | POA: Diagnosis present

## 2015-04-29 DIAGNOSIS — R112 Nausea with vomiting, unspecified: Secondary | ICD-10-CM | POA: Diagnosis present

## 2015-04-29 DIAGNOSIS — G43A0 Cyclical vomiting, not intractable: Principal | ICD-10-CM | POA: Insufficient documentation

## 2015-04-29 DIAGNOSIS — Q909 Down syndrome, unspecified: Secondary | ICD-10-CM | POA: Diagnosis not present

## 2015-04-29 LAB — COMPREHENSIVE METABOLIC PANEL
ALK PHOS: 70 U/L (ref 38–126)
ALT: 17 U/L (ref 17–63)
ANION GAP: 13 (ref 5–15)
AST: 23 U/L (ref 15–41)
Albumin: 4.3 g/dL (ref 3.5–5.0)
BUN: 17 mg/dL (ref 6–20)
CALCIUM: 9.1 mg/dL (ref 8.9–10.3)
CHLORIDE: 105 mmol/L (ref 101–111)
CO2: 24 mmol/L (ref 22–32)
Creatinine, Ser: 1.25 mg/dL — ABNORMAL HIGH (ref 0.61–1.24)
GFR calc non Af Amer: >60 mL/min (ref >60)
Glucose, Bld: 154 mg/dL — ABNORMAL HIGH (ref 65–99)
Potassium: 3.7 mmol/L (ref 3.5–5.1)
SODIUM: 142 mmol/L (ref 135–145)
Total Bilirubin: 0.8 mg/dL (ref 0.3–1.2)
Total Protein: 7.4 g/dL (ref 6.5–8.1)

## 2015-04-29 LAB — URINALYSIS, ROUTINE W REFLEX MICROSCOPIC
BILIRUBIN URINE: NEGATIVE
Glucose, UA: NEGATIVE mg/dL
Hgb urine dipstick: NEGATIVE
KETONES UR: NEGATIVE mg/dL
Leukocytes, UA: NEGATIVE
NITRITE: NEGATIVE
PROTEIN: NEGATIVE mg/dL
SPECIFIC GRAVITY, URINE: 1.023 (ref 1.005–1.030)
pH: 7 (ref 5.0–8.0)

## 2015-04-29 LAB — CBC WITH DIFFERENTIAL/PLATELET
Basophils Absolute: 0 10*3/uL (ref 0.0–0.1)
Basophils Relative: 0 %
EOS ABS: 0 10*3/uL (ref 0.0–0.7)
EOS PCT: 0 %
HCT: 42.8 % (ref 39.0–52.0)
Hemoglobin: 14.1 g/dL (ref 13.0–17.0)
LYMPHS ABS: 0.7 10*3/uL (ref 0.7–4.0)
Lymphocytes Relative: 4 %
MCH: 31.5 pg (ref 26.0–34.0)
MCHC: 32.9 g/dL (ref 30.0–36.0)
MCV: 95.5 fL (ref 78.0–100.0)
MONO ABS: 0.7 10*3/uL (ref 0.1–1.0)
MONOS PCT: 5 %
Neutro Abs: 13.5 10*3/uL — ABNORMAL HIGH (ref 1.7–7.7)
Neutrophils Relative %: 91 %
PLATELETS: 271 10*3/uL (ref 150–400)
RBC: 4.48 MIL/uL (ref 4.22–5.81)
RDW: 14.7 % (ref 11.5–15.5)
WBC: 14.9 10*3/uL — ABNORMAL HIGH (ref 4.0–10.5)

## 2015-04-29 LAB — LIPASE, BLOOD: Lipase: 28 U/L (ref 11–51)

## 2015-04-29 LAB — LACTIC ACID, PLASMA: LACTIC ACID, VENOUS: 1.1 mmol/L (ref 0.5–2.0)

## 2015-04-29 MED ORDER — IOHEXOL 300 MG/ML  SOLN
100.0000 mL | Freq: Once | INTRAMUSCULAR | Status: AC | PRN
  Administered 2015-04-29: 100 mL via INTRAVENOUS

## 2015-04-29 MED ORDER — ACETAMINOPHEN 325 MG PO TABS: 650.0000 mg | ORAL_TABLET | Freq: Four times a day (QID) | ORAL | Status: DC | PRN

## 2015-04-29 MED ORDER — SODIUM CHLORIDE 0.9 % IV BOLUS (SEPSIS)
1000.0000 mL | Freq: Once | INTRAVENOUS | Status: AC
  Administered 2015-04-29: 1000 mL via INTRAVENOUS

## 2015-04-29 MED ORDER — SODIUM CHLORIDE 0.9 % IV SOLN
INTRAVENOUS | Status: DC
  Administered 2015-04-29 – 2015-04-30 (×2): via INTRAVENOUS

## 2015-04-29 MED ORDER — PROMETHAZINE HCL 25 MG/ML IJ SOLN
25.0000 mg | Freq: Once | INTRAMUSCULAR | Status: AC
  Administered 2015-04-29: 25 mg via INTRAVENOUS
  Filled 2015-04-29: qty 1

## 2015-04-29 MED ORDER — METRONIDAZOLE IN NACL 5-0.79 MG/ML-% IV SOLN
500.0000 mg | Freq: Three times a day (TID) | INTRAVENOUS | Status: DC
  Administered 2015-04-29 – 2015-04-30 (×2): 500 mg via INTRAVENOUS
  Filled 2015-04-29 (×2): qty 100

## 2015-04-29 MED ORDER — ONDANSETRON HCL 4 MG/2ML IJ SOLN
4.0000 mg | Freq: Once | INTRAMUSCULAR | Status: AC
  Administered 2015-04-29: 4 mg via INTRAVENOUS
  Filled 2015-04-29: qty 2

## 2015-04-29 MED ORDER — ENOXAPARIN SODIUM 40 MG/0.4ML ~~LOC~~ SOLN
40.0000 mg | SUBCUTANEOUS | Status: DC
  Administered 2015-04-29 – 2015-04-30 (×2): 40 mg via SUBCUTANEOUS
  Filled 2015-04-29 (×2): qty 0.4

## 2015-04-29 MED ORDER — ALBUTEROL SULFATE (2.5 MG/3ML) 0.083% IN NEBU: 2.5000 mg | INHALATION_SOLUTION | RESPIRATORY_TRACT | Status: DC | PRN

## 2015-04-29 MED ORDER — ONDANSETRON HCL 4 MG/2ML IJ SOLN: 4.0000 mg | Freq: Four times a day (QID) | INTRAMUSCULAR | Status: DC | PRN

## 2015-04-29 MED ORDER — METOCLOPRAMIDE HCL 5 MG/ML IJ SOLN: 5.0000 mg | Freq: Once | INTRAMUSCULAR | Status: DC

## 2015-04-29 MED ORDER — LORAZEPAM 2 MG/ML IJ SOLN
0.5000 mg | Freq: Once | INTRAMUSCULAR | Status: AC
  Administered 2015-04-29: 0.5 mg via INTRAVENOUS
  Filled 2015-04-29: qty 1

## 2015-04-29 MED ORDER — ACETAMINOPHEN 650 MG RE SUPP: 650.0000 mg | Freq: Four times a day (QID) | RECTAL | Status: DC | PRN

## 2015-04-29 MED ORDER — ONDANSETRON HCL 4 MG PO TABS: 4.0000 mg | ORAL_TABLET | Freq: Four times a day (QID) | ORAL | Status: DC | PRN

## 2015-04-29 NOTE — ED Notes (Signed)
Patient transported to CT 

## 2015-04-29 NOTE — ED Notes (Signed)
Ginger Ale given to patient. 

## 2015-04-29 NOTE — ED Notes (Signed)
Per pt's parents he can tell them when he needs to void.  Parents are aware that a urine specimen is needed.  Urinal at the bedside.  Parents would like to wait until pt can void on his own as opposed to I&O cath.

## 2015-04-29 NOTE — ED Notes (Signed)
Fluid given to patient

## 2015-04-29 NOTE — ED Notes (Signed)
Patient not able to tolerate fluids due to N/V.

## 2015-04-29 NOTE — H&P (Signed)
[facility] History and Physical  Levi Powell DQQ:[id number] DOB: 1985/05/01 DOA: 04/29/2015   PCP: Sheral Apley  Specialists: None  Chief Complaint: Nausea, vomiting and diarrhea  HPI: Levi Powell is a 30 y.o. male with a past medical history of Down syndrome who was recently hospitalized for a urinary tract infection and discharged on January 20th. He was discharged on a 10 day course of ciprofloxacin. He completed the course of the antibiotics. Patient lives with his parents. He has Down syndrome and does not communicate well. Most of the history was obtained from the parents. Hence, history is limited. Apparently patient was doing well after he came home. And then over the last 24 hours he has been coughing and then had episodes of vomiting. He's had numerous episodes of vomiting without any blood. No history of fever. Parent's have noted some abdominal discomfort. And then while he was being evaluated in the emergency department, patient had the multiple episodes of diarrhea.  Evaluation in the ED included blood work and CT scan. He was found to have leukocytosis. CT scan did show subtle changes in the colon suggestive of colitis. He will be hospitalized for further management as he is unable to take anything orally at this time.  Home Medications: Prior to Admission medications   Medication Sig Start Date End Date Taking? Authorizing Provider  acetaminophen (TYLENOL) 325 MG tablet Take 2 tablets (650 mg total) by mouth every 6 (six) hours as needed for mild pain (or Fever >/= 101). 03/22/15  Yes Belkys A Regalado, MD  omeprazole (PRILOSEC) 20 MG capsule Take 20 mg by mouth daily. 04/09/15  Yes Historical Provider, MD  ciprofloxacin (CIPRO) 500 MG tablet Take 1 tablet (500 mg total) by mouth 2 (two) times daily. Patient not taking: Reported on 04/29/2015 03/22/15   Elmarie Shiley, MD    Allergies: No Known Allergies  Past Medical History: Past Medical  History  Diagnosis Date  . Down syndrome     Past Surgical History  Procedure Laterality Date  . Myringotomy    . No past surgeries      Social History: Lives with his parents in [place]. No history of smoking, alcohol use or illicit drug use. Usually independent with daily activities. However, require assistance with IADLs and ADLs.   Family History:  Family History  Problem Relation Age of Onset  . Hypertension Mother   . Cancer Mother      Review of Systems - unable to do in this individual with Down syndrome  Physical Examination  Filed Vitals:   04/29/15 0843 04/29/15 1117 04/29/15 1424 04/29/15 1704  BP: 122/70 105/65 104/65 101/60  Pulse: 87 89 91 86  Temp: 98.1 F (36.7 C)   98.2 F (36.8 C)  TempSrc: Oral   Oral  Resp: 16 14 16 16   SpO2: 100% 96% 95% 95%    BP 101/60 mmHg  Pulse 86  Temp(Src) 98.2 F (36.8 C) (Oral)  Resp 16  SpO2 95%  General appearance: alert, appears stated age, distracted and no distress Head: Normocephalic, without obvious abnormality, atraumatic Eyes: conjunctivae/corneas clear. PERRL, EOM's intact.  Throat: dry mucus membrane Neck: no adenopathy, no carotid bruit, no JVD, supple, symmetrical, trachea midline and thyroid not enlarged, symmetric, no tenderness/mass/nodules Resp: clear to auscultation bilaterally Cardio: regular rate and rhythm, S1, S2 normal, no murmur, click, rub or gallop GI: Abdomen is soft for the most part. No tenderness appreciated. No masses or organomegaly. Bowel sounds are present. Extremities:  extremities normal, atraumatic, no cyanosis or edema Pulses: 2+ and symmetric Skin: Skin color, texture, turgor normal. No rashes or lesions Lymph nodes: Cervical, supraclavicular, and axillary nodes normal. Neurologic: He is awake. Distracted. Does not communicate well due to his Down syndrome. Moving all his extremities. Not fully cooperative with examination.  Laboratory Data: Results for orders placed or  performed during the hospital encounter of 04/29/15 (from the past 48 hour(s))  CBC with Differential/Platelet     Status: Abnormal   Collection Time: 04/29/15  5:46 AM  Result Value Ref Range   WBC 14.9 (H) 4.0 - 10.5 K/uL   RBC 4.48 4.22 - 5.81 MIL/uL   Hemoglobin 14.1 13.0 - 17.0 g/dL   HCT 42.8 39.0 - 52.0 %   MCV 95.5 78.0 - 100.0 fL   MCH 31.5 26.0 - 34.0 pg   MCHC 32.9 30.0 - 36.0 g/dL   RDW 14.7 11.5 - 15.5 %   Platelets 271 150 - 400 K/uL   Neutrophils Relative % 91 %   Neutro Abs 13.5 (H) 1.7 - 7.7 K/uL   Lymphocytes Relative 4 %   Lymphs Abs 0.7 0.7 - 4.0 K/uL   Monocytes Relative 5 %   Monocytes Absolute 0.7 0.1 - 1.0 K/uL   Eosinophils Relative 0 %   Eosinophils Absolute 0.0 0.0 - 0.7 K/uL   Basophils Relative 0 %   Basophils Absolute 0.0 0.0 - 0.1 K/uL  Comprehensive metabolic panel     Status: Abnormal   Collection Time: 04/29/15  5:46 AM  Result Value Ref Range   Sodium 142 135 - 145 mmol/L   Potassium 3.7 3.5 - 5.1 mmol/L   Chloride 105 101 - 111 mmol/L   CO2 24 22 - 32 mmol/L   Glucose, Bld 154 (H) 65 - 99 mg/dL   BUN 17 6 - 20 mg/dL   Creatinine, Ser 1.25 (H) 0.61 - 1.24 mg/dL   Calcium 9.1 8.9 - 10.3 mg/dL   Total Protein 7.4 6.5 - 8.1 g/dL   Albumin 4.3 3.5 - 5.0 g/dL   AST 23 15 - 41 U/L   ALT 17 17 - 63 U/L   Alkaline Phosphatase 70 38 - 126 U/L   Total Bilirubin 0.8 0.3 - 1.2 mg/dL   GFR calc non Af Amer >60 >60 mL/min   GFR calc Af Amer >60 >60 mL/min    Comment: (NOTE) The eGFR has been calculated using the CKD EPI equation. This calculation has not been validated in all clinical situations. eGFR's persistently <60 mL/min signify possible Chronic Kidney Disease.    Anion gap 13 5 - 15  Lipase, blood     Status: None   Collection Time: 04/29/15  5:46 AM  Result Value Ref Range   Lipase 28 11 - 51 U/L  Urinalysis, Routine w reflex microscopic (not at [facility])     Status: None   Collection Time: 04/29/15  8:12 AM  Result Value Ref Range    Color, Urine YELLOW YELLOW   APPearance CLEAR CLEAR   Specific Gravity, Urine 1.023 1.005 - 1.030   pH 7.0 5.0 - 8.0   Glucose, UA NEGATIVE NEGATIVE mg/dL   Hgb urine dipstick NEGATIVE NEGATIVE   Bilirubin Urine NEGATIVE NEGATIVE   Ketones, ur NEGATIVE NEGATIVE mg/dL   Protein, ur NEGATIVE NEGATIVE mg/dL   Nitrite NEGATIVE NEGATIVE   Leukocytes, UA NEGATIVE NEGATIVE    Comment: MICROSCOPIC NOT DONE ON URINES WITH NEGATIVE PROTEIN, BLOOD, LEUKOCYTES, NITRITE, OR GLUCOSE <1000 mg/dL.  Radiology Reports: Ct Abdomen Pelvis W Contrast  04/29/2015  CLINICAL DATA:  Coughing and vomiting beginning at 0300 hours, abdominal pain, dry heaving, history Down syndrome EXAM: CT ABDOMEN AND PELVIS WITH CONTRAST TECHNIQUE: Multidetector CT imaging of the abdomen and pelvis was performed using the standard protocol following bolus administration of intravenous contrast. Sagittal and coronal MPR images reconstructed from axial data set. CONTRAST:  132m OMNIPAQUE IOHEXOL 300 MG/ML SOLN IV. Oral contrast was not administered. COMPARISON:  03/20/2015, 03/31/2010 FINDINGS: Lung bases clear. 6 mm low-attenuation focus RIGHT lobe liver image 31 unchanged. Focal fatty infiltration of liver adjacent to falciform fissure. Small lipoma at pancreatic tail 10 mm greatest diameter 6 unchanged. Liver, spleen, pancreas, kidneys, and adrenal glands otherwise normal. Normal appendix. Bladder wall prominent though suspect artifact related to underdistention Stomach and small bowel loops normal appearance for technique. Colon is u opacified and under distended though there is questionably mild hyperemia of the sigmoid mesocolon, cannot exclude subtle colitis. No mass, adenopathy, free air or free fluid. Osseous structures unremarkable. IMPRESSION: Small lipoma the pancreatic tail. Stable nonspecific low-attenuation lesion RIGHT lobe liver. Unable to completely exclude subtle sigmoid colitis though this could be an artifact from  underdistention. Electronically Signed   By: MLavonia DanaM.D.   On: 04/29/2015 12:55   Dg Abd Acute W/chest  04/29/2015  CLINICAL DATA:  Cough, nausea and vomiting beginning this morning. History of down syndrome. EXAM: DG ABDOMEN ACUTE W/ 1V CHEST COMPARISON:  Chest radiograph March 19, 2014 FINDINGS: Cardiomediastinal silhouette is normal. Similar mild bronchitic changes. Lungs are clear, no pleural effusions. No pneumothorax. Soft tissue planes and included osseous structures are unremarkable. Paucity of bowel gas, visualized bowel gas pattern is nondilated and nonobstructive. No intra-abdominal mass effect, pathologic calcifications or free air. Soft tissue planes and included osseous structures are non-suspicious. IMPRESSION: Similar mild bronchitic changes. Paucity of bowel gas. Electronically Signed   By: CElon AlasM.D.   On: 04/29/2015 05:54     Problem List  Principal Problem:   Intractable vomiting Active Problems:   Diarrhea   Down syndrome   Assessment: This is a 30year old Hispanic male with Down syndrome who comes in with nausea, vomiting and diarrhea. Patient was on antibiotics recently. Hence C. difficile is a possibility. This could also be acute gastroenteritis. He was recently hospitalized for UTI. UA is clear.  Plan: #1 Nausea, vomiting and diarrhea: Stool study is pending. CT scan did suggest subtle changes suggestive of colitis. Considering that he has been on antibiotics recently, C. difficile is a possibility. For now, he'll be placed only on IV Flagyl. Oral Vancomycin was considered. However, since he is vomiting, this may not be possible at this time. Hopefully, we will have results of C. difficile PCR soon and then further decisions regarding treatment can be made at that time. Parents also mention cough. However, no cough was noted during this examination. Chest x-ray does not show any concerning findings.  #2 Dehydration with mildly elevated creatinine: He  will be given IV fluids. Repeat labs tomorrow. Monitor urine output.  #3 history of Down syndrome: Stable.  DVT Prophylaxis: Lovenox Code Status: Full code Family Communication: Discussed with the patient's parents  Disposition Plan: Admit to MedSurg   Further management decisions will depend on results of further testing and patient's response to treatment.   [facility] Pager [phone] If 7PM-7AM, please contact night-coverage [email or web address] Password [email or web address] 04/29/2015, 5:46 PM

## 2015-04-29 NOTE — ED Notes (Signed)
Patient transported to X-ray 

## 2015-04-29 NOTE — ED Notes (Signed)
Report given to Tonya, RN.

## 2015-04-29 NOTE — ED Notes (Signed)
Per family report: pt woke up around 3am and began coughing and vomiting.  Family reports that pt's emesis contained food.  Pt heaving and coughing nonstop in ED.

## 2015-04-29 NOTE — ED Provider Notes (Signed)
Care transferred from [facility] at end of shift. Levi Powell is an 30 y.o. male with history of down syndrome, brought to the ED by his parents for evaluation of coughing and vomiting. Pt was reportedly in his usual state of health until 3 AM this morning when he woke up coughing and has had several episodes of NBNB emesis. No diarrhea. Pt is able to verbalize his abdomen hurts but cannot localize the pain. Pt's parents deny new foods/exposures, deny sick contacts. His parents do report h/o e.coli UTI last month which was reportedly treated. Abdominal labs pending. Acute abdominal series showed some bronchitic changes but was otherwise unrevealing. If labs unremarkable and pt tolerating PO, will plan to d/c home with PCP f/u.   On my initial evaluation pt is sleeping comfortably in the room. When awoken, he has no abdominal tenderness, guarding, or rigidity. NAD. His parents report he has had no further episodes of emesis. Labs are pending.  Physical Exam  BP 130/60 mmHg  Pulse 111  Temp(Src) 98.5 F (36.9 C) (Oral)  Resp 16  SpO2 98%  Physical Exam  Constitutional: He is sleeping. He is easily aroused.  HENT:  Right Ear: External ear normal.  Left Ear: External ear normal.  Nose: Nose normal.  Eyes: Conjunctivae are normal. No scleral icterus.  Cardiovascular: Normal rate and regular rhythm.   Pulmonary/Chest: Effort normal. No respiratory distress.  Abdominal: Soft. Bowel sounds are normal. He exhibits no distension. There is no tenderness. There is no guarding.  Neurological: He is easily aroused.  Skin: Skin is warm and dry. There is pallor.  Psychiatric: He has a normal mood and affect. His behavior is normal.  Nursing note and vitals reviewed.   Filed Vitals:   04/29/15 0600 04/29/15 0630 04/29/15 0843 04/29/15 1117  BP: 115/71 98/62 122/70 105/65  Pulse: 97 95 87 89  Temp:   98.1 F (36.7 C)   TempSrc:   Oral   Resp:   16 14  SpO2: 93% 97% 100% 96%     ED Course   Procedures   Results for orders placed or performed during the hospital encounter of 04/29/15  CBC with Differential/Platelet  Result Value Ref Range   WBC 14.9 (H) 4.0 - 10.5 K/uL   RBC 4.48 4.22 - 5.81 MIL/uL   Hemoglobin 14.1 13.0 - 17.0 g/dL   HCT 40.9 81.1 - 91.4 %   MCV 95.5 78.0 - 100.0 fL   MCH 31.5 26.0 - 34.0 pg   MCHC 32.9 30.0 - 36.0 g/dL   RDW 78.2 95.6 - 21.3 %   Platelets 271 150 - 400 K/uL   Neutrophils Relative % 91 %   Neutro Abs 13.5 (H) 1.7 - 7.7 K/uL   Lymphocytes Relative 4 %   Lymphs Abs 0.7 0.7 - 4.0 K/uL   Monocytes Relative 5 %   Monocytes Absolute 0.7 0.1 - 1.0 K/uL   Eosinophils Relative 0 %   Eosinophils Absolute 0.0 0.0 - 0.7 K/uL   Basophils Relative 0 %   Basophils Absolute 0.0 0.0 - 0.1 K/uL  Comprehensive metabolic panel  Result Value Ref Range   Sodium 142 135 - 145 mmol/L   Potassium 3.7 3.5 - 5.1 mmol/L   Chloride 105 101 - 111 mmol/L   CO2 24 22 - 32 mmol/L   Glucose, Bld 154 (H) 65 - 99 mg/dL   BUN 17 6 - 20 mg/dL   Creatinine, Ser 0.86 (H) 0.61 - 1.24 mg/dL   Calcium  9.1 8.9 - 10.3 mg/dL   Total Protein 7.4 6.5 - 8.1 g/dL   Albumin 4.3 3.5 - 5.0 g/dL   AST 23 15 - 41 U/L   ALT 17 17 - 63 U/L   Alkaline Phosphatase 70 38 - 126 U/L   Total Bilirubin 0.8 0.3 - 1.2 mg/dL   GFR calc non Af Amer >60 >60 mL/min   GFR calc Af Amer >60 >60 mL/min   Anion gap 13 5 - 15  Lipase, blood  Result Value Ref Range   Lipase 28 11 - 51 U/L   Ct Abdomen Pelvis W Contrast  04/29/2015  CLINICAL DATA:  Coughing and vomiting beginning at 0300 hours, abdominal pain, dry heaving, history Down syndrome EXAM: CT ABDOMEN AND PELVIS WITH CONTRAST TECHNIQUE: Multidetector CT imaging of the abdomen and pelvis was performed using the standard protocol following bolus administration of intravenous contrast. Sagittal and coronal MPR images reconstructed from axial data set. CONTRAST:  OMNIPAQUE IOHEXOL 300 MG/ML SOLN IV. Oral contrast was not administered.  COMPARISON:  03/20/2015, 03/31/2010 FINDINGS: Lung bases clear. 6 mm low-attenuation focus RIGHT lobe liver image 31 unchanged. Focal fatty infiltration of liver adjacent to falciform fissure. Small lipoma at pancreatic tail 10 mm greatest diameter 6 unchanged. Liver, spleen, pancreas, kidneys, and adrenal glands otherwise normal. Normal appendix. Bladder wall prominent though suspect artifact related to underdistention Stomach and small bowel loops normal appearance for technique. Colon is u opacified and under distended though there is questionably mild hyperemia of the sigmoid mesocolon, cannot exclude subtle colitis. No mass, adenopathy, free air or free fluid. Osseous structures unremarkable. IMPRESSION: Small lipoma the pancreatic tail. Stable nonspecific low-attenuation lesion RIGHT lobe liver. Unable to completely exclude subtle sigmoid colitis though this could be an artifact from underdistention. Electronically Signed   By: Ulyses Southward M.D.   On: 04/29/2015 12:55   Dg Abd Acute W/chest  04/29/2015  CLINICAL DATA:  Cough, nausea and vomiting beginning this morning. History of down syndrome. EXAM: DG ABDOMEN ACUTE W/ 1V CHEST COMPARISON:  Chest radiograph March 19, 2014 FINDINGS: Cardiomediastinal silhouette is normal. Similar mild bronchitic changes. Lungs are clear, no pleural effusions. No pneumothorax. Soft tissue planes and included osseous structures are unremarkable. Paucity of bowel gas, visualized bowel gas pattern is nondilated and nonobstructive. No intra-abdominal mass effect, pathologic calcifications or free air. Soft tissue planes and included osseous structures are non-suspicious. IMPRESSION: Similar mild bronchitic changes. Paucity of bowel gas. Electronically Signed   By: Awilda Metro M.D.   On: 04/29/2015 05:54    MDM  White count 14.9, Cr 1.25. UA pending. Pt's parents would like to hold off on cath if possible. Pt continues to rest comfortably in room.   UA  unremarkable. On repeat exam pt remains drowsy but arousable, likely due to ativan. He has a benign abdominal exam with no tenderness or guarding. VSS. Pt's parents report one loose BM in interim. Given nonfocal abdominal exam with resolution of vomiting in the ED, will hold off on further imaging at this time. Suspect viral gastroenteritis. Will fluid challenge.   Pt now with NBNB emesis at bedside x 2. Zofran and phenergan ordered.  Pt initially improved with phenergan. Had few sips of water with no emesis x 30 min. However, while preparing for discharge pt had voluminous explosive watery diarrheal episode in room and large volume projectile emesis as well (unwitnessed by me). Will add CT abd/pelvis. Continue to suspect infectious etiology with low suspicion for SBO, appendicitis,  cholecystitis, pancreatitis, neoplastic etiology. At this point suspect will have to admit for intractable n/v. Pt's parents do not feel comfortable taking him home even if tolerating PO. C diff PCR ordered given pt's cipro/abx therapy last month. Additional fluid bolus ordered. Will hold off on additional anti emetics or haldol at this point given pt's drowsiness.   CT shows questionable sigmoid colitis, otherwise there is a small ipoma of pancreatic tail, stable nonspecific lesion of right liver lobe. Pt has had yet another episode of emesis, though lower volume. Spoke to pt's parents. They would feel more comfortable with hospital admission. I spoke to Dr. Rito Ehrlich of hospitalist team who will admit pt to med-surg obs for intractable vomiting. C-diff study pending.   Carlene Coria, PA-C 04/29/15 1356

## 2015-04-29 NOTE — ED Provider Notes (Signed)
CSN: [id number]     Arrival date & time 04/29/15  0503 History   None    Chief Complaint  Patient presents with  . Emesis  . Cough     (Consider location/radiation/quality/duration/timing/severity/associated sxs/prior Treatment) HPI   Patient with Down Syndrome comes to the ER bib his mother and father for waking up at 3 am and began coughing and vomiting. The emesis has contained only food. They are unsure of what he ate last night. The patient also says he does not remember for sure. He says that his stomach hurts but is unable to specify where or what the quality of the pain is. The patients father tells me that Tj frequent complaints of stomach pains. Kylo is dry heaving and has his head in the trash bag and does not want to talk during HPI.  Past Medical History  Diagnosis Date  . Down syndrome    Past Surgical History  Procedure Laterality Date  . Myringotomy    . No past surgeries     Family History  Problem Relation Age of Onset  . Hypertension Mother   . Cancer Mother    Social History  Substance Use Topics  . Smoking status: Never Smoker   . Smokeless tobacco: None  . Alcohol Use: No    Review of Systems  Review of Systems All other systems negative except as documented in the HPI. All pertinent positives and negatives as reviewed in the HPI.   Allergies  Review of patient's allergies indicates no known allergies.  Home Medications   Prior to Admission medications   Medication Sig Start Date End Date Taking? Authorizing Provider  acetaminophen (TYLENOL) 325 MG tablet Take 2 tablets (650 mg total) by mouth every 6 (six) hours as needed for mild pain (or Fever >/= 101). 03/22/15   Belkys A Regalado, MD  ciprofloxacin (CIPRO) 500 MG tablet Take 1 tablet (500 mg total) by mouth 2 (two) times daily. 03/22/15   Belkys A Regalado, MD   BP 158/128 mmHg  Pulse 111  Temp(Src) 98.5 F (36.9 C) (Oral)  Resp 16  SpO2 98% Physical Exam  Constitutional: He  appears well-developed and well-nourished. No distress.  HENT:  Head: Normocephalic and atraumatic.  Right Ear: Tympanic membrane and ear canal normal.  Left Ear: Tympanic membrane and ear canal normal.  Nose: Nose normal.  Mouth/Throat: Uvula is midline, oropharynx is clear and moist and mucous membranes are normal.  Eyes: Pupils are equal, round, and reactive to light.  Neck: Normal range of motion. Neck supple.  Cardiovascular: Normal rate and regular rhythm.   Pulmonary/Chest: Effort normal and breath sounds normal. He has no decreased breath sounds. He has no wheezes.  Coughing during exam  Abdominal: Soft.  No signs of abdominal distention. Soft, non tender with no guarding.  Musculoskeletal:  No LE swelling  Neurological: He is alert.  Acting at baseline  Skin: Skin is warm and dry. No rash noted.  Nursing note and vitals reviewed.   ED Course  Procedures (including critical care time) Labs Review Labs Reviewed  CBC WITH DIFFERENTIAL/PLATELET  COMPREHENSIVE METABOLIC PANEL  LIPASE, BLOOD    Imaging Review No results found. I have personally reviewed and evaluated these images and lab results as part of my medical decision-making.   EKG Interpretation None      MDM   Final diagnoses:  None    Treon presents to the emergency department with vomiting and coughing since 3 AM this morning. He's  been coughing up stomach contents. He does complain of nonfocal abdominal pain.  Will obtain CBC, CMP, Lipase and a acute abd w/ chest xray for further evaluation. For therapy he has been given 2 saline fluids,4 mg Zofran IV and 0,5 mg Ativan IV.  At end of shift, patient sign out to [place], PA-C.  Currently patient has not yet had fluids, labs or medications. Will need to follow-up on labs and images. Also reasses. If he is feeling better and all other tests are normal he will need fluid challenge before dc.    Marlon Pel, PA-C 04/29/15 1610  Derwood Kaplan,  MD 04/29/15 [phone]

## 2015-04-29 NOTE — ED Notes (Signed)
Stool sample obtained in ED

## 2015-04-30 DIAGNOSIS — A0811 Acute gastroenteropathy due to Norwalk agent: Secondary | ICD-10-CM

## 2015-04-30 DIAGNOSIS — G43A0 Cyclical vomiting, not intractable: Secondary | ICD-10-CM | POA: Diagnosis not present

## 2015-04-30 DIAGNOSIS — Q909 Down syndrome, unspecified: Secondary | ICD-10-CM | POA: Diagnosis not present

## 2015-04-30 LAB — GASTROINTESTINAL PANEL BY PCR, STOOL (REPLACES STOOL CULTURE)
ADENOVIRUS F40/41: NOT DETECTED
Astrovirus: NOT DETECTED
CAMPYLOBACTER SPECIES: NOT DETECTED
CRYPTOSPORIDIUM: NOT DETECTED
Cyclospora cayetanensis: NOT DETECTED
E. coli O157: NOT DETECTED
ENTEROPATHOGENIC E COLI (EPEC): NOT DETECTED
ENTEROTOXIGENIC E COLI (ETEC): NOT DETECTED
Entamoeba histolytica: NOT DETECTED
Enteroaggregative E coli (EAEC): NOT DETECTED
Giardia lamblia: NOT DETECTED
Norovirus GI/GII: DETECTED — AB
PLESIMONAS SHIGELLOIDES: NOT DETECTED
ROTAVIRUS A: NOT DETECTED
SHIGA LIKE TOXIN PRODUCING E COLI (STEC): NOT DETECTED
Salmonella species: NOT DETECTED
Sapovirus (I, II, IV, and V): NOT DETECTED
Shigella/Enteroinvasive E coli (EIEC): NOT DETECTED
Vibrio cholerae: NOT DETECTED
Vibrio species: NOT DETECTED
YERSINIA ENTEROCOLITICA: NOT DETECTED

## 2015-04-30 LAB — COMPREHENSIVE METABOLIC PANEL
ALK PHOS: 46 U/L (ref 38–126)
ALT: 14 U/L — ABNORMAL LOW (ref 17–63)
ANION GAP: 8 (ref 5–15)
AST: 17 U/L (ref 15–41)
Albumin: 3.3 g/dL — ABNORMAL LOW (ref 3.5–5.0)
BILIRUBIN TOTAL: 0.7 mg/dL (ref 0.3–1.2)
BUN: 13 mg/dL (ref 6–20)
CALCIUM: 8.2 mg/dL — AB (ref 8.9–10.3)
CO2: 25 mmol/L (ref 22–32)
Chloride: 108 mmol/L (ref 101–111)
Creatinine, Ser: 1.26 mg/dL — ABNORMAL HIGH (ref 0.61–1.24)
GLUCOSE: 99 mg/dL (ref 65–99)
Potassium: 3.7 mmol/L (ref 3.5–5.1)
Sodium: 141 mmol/L (ref 135–145)
TOTAL PROTEIN: 5.8 g/dL — AB (ref 6.5–8.1)

## 2015-04-30 LAB — C DIFFICILE QUICK SCREEN W PCR REFLEX
C DIFFICILE (CDIFF) INTERP: NEGATIVE
C DIFFICILE (CDIFF) TOXIN: NEGATIVE
C Diff antigen: NEGATIVE

## 2015-04-30 LAB — CBC
HCT: 37.7 % — ABNORMAL LOW (ref 39.0–52.0)
HEMOGLOBIN: 12.2 g/dL — AB (ref 13.0–17.0)
MCH: 31.2 pg (ref 26.0–34.0)
MCHC: 32.4 g/dL (ref 30.0–36.0)
MCV: 96.4 fL (ref 78.0–100.0)
Platelets: 199 10*3/uL (ref 150–400)
RBC: 3.91 MIL/uL — AB (ref 4.22–5.81)
RDW: 15.2 % (ref 11.5–15.5)
WBC: 6.8 10*3/uL (ref 4.0–10.5)

## 2015-04-30 NOTE — Care Management Obs Status (Signed)
MEDICARE OBSERVATION STATUS NOTIFICATION   Patient Details  Name: AYDAN LEVITZ MRN: [id number] Date of Birth: 15-Mar-1985   Medicare Observation Status Notification Given:  Yes    Lanier Clam, RN 04/30/2015, 12:23 PM

## 2015-04-30 NOTE — Progress Notes (Signed)
[facility] Progress Note   Levi Powell  FAO:[id number]  DOB: 06-29-1985  DOA: 04/29/2015 PCP: Jamal Collin, PA-C  Brief narrative: Levi Powell is a 30 y.o. 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 He returns for nausea, vomiting and diarrhea.    Subjective: Vomiting resolved. Diarrhea appears to be slowing down. No abdominal pain.   Assessment/Plan: Principal Problem:   Intractable vomiting/  Diarrhea / leukocytosis - CT abd/pelvis shows "mild hyperemia of the sigmoid mesocolon" - C diff negative- GI pathogen panel positive for Norovirus - advance to full liquids today  Active Problems:   Down syndrome/ non-verbal - parents in room able to communicate with him    Antibiotics: Anti-infectives    Start     Dose/Rate Route Frequency Ordered Stop   04/29/15 1800  metroNIDAZOLE (FLAGYL) IVPB 500 mg  Status:  Discontinued     500 mg 100 mL/hr over 60 Minutes Intravenous Every 8 hours 04/29/15 1702 04/30/15 1002     Code Status:     Code Status Orders        Start     Ordered   04/29/15 1703  Full code   Continuous     04/29/15 1702    Code Status History    Date Active Date Inactive Code Status Order ID Comments User Context   03/20/2015  7:54 PM 03/22/2015  4:20 PM Full Code [id number]  Briscoe Deutscher, MD ED     Family Communication: parents Disposition Plan: home in 1-2 days DVT prophylaxis: Lovenox Consultants:  Procedures:     Objective: There were no vitals filed for this visit.  Intake/Output Summary (Last 24 hours) at 04/30/15 1710 Last data filed at 04/30/15 1431  Gross per 24 hour  Intake 2097.5 ml  Output      0 ml  Net 2097.5 ml     Vitals Filed Vitals:   04/29/15 1704 04/29/15 2304 04/30/15 0506 04/30/15 1429  BP: 101/60 113/59 112/67 128/76  Pulse: 86 81 79 88  Temp: 98.2 F (36.8 C) 99.1 F (37.3 C) 98.2 F (36.8 C) 98.4 F (36.9 C)   TempSrc: Oral Oral Oral Oral  Resp: SpO2: 95% 97% 98% 100%    Exam:  General:  Pt is alert, not in acute distress  HEENT: No icterus, No thrush, oral mucosa moist  Cardiovascular: regular rate and rhythm, S1/S2 No murmur  Respiratory: clear to auscultation bilaterally   Abdomen: Soft, +Bowel sounds, non tender, non distended, no guarding  MSK: No cyanosis or clubbing- no pedal edema   Data Reviewed: Basic Metabolic Panel:  Recent Labs Lab 04/29/15 0546 04/30/15 0325  NA 142 141  K 3.7 3.7  CL 105 108  CO2 24 25  GLUCOSE 154* 99  BUN 17 13  CREATININE 1.25* 1.26*  CALCIUM 9.1 8.2*   Liver Function Tests:  Recent Labs Lab 04/29/15 0546 04/30/15 0325  AST 23 17  ALT 17 14*  ALKPHOS 70 46  BILITOT 0.8 0.7  PROT 7.4 5.8*  ALBUMIN 4.3 3.3*    Recent Labs Lab 04/29/15 0546  LIPASE 28   No results for input(s): AMMONIA in the last 168 hours. CBC:  Recent Labs Lab 04/29/15 0546 04/30/15 0325  WBC 14.9* 6.8  NEUTROABS 13.5*  --   HGB 14.1 12.2*  HCT 42.8 37.7*  MCV 95.5 96.4  PLT 271 199   Cardiac Enzymes: No results for  input(s): CKTOTAL, CKMB, CKMBINDEX, TROPONINI in the last 168 hours. BNP (last 3 results)  Recent Labs  03/20/15 2024  BNP 43.7    ProBNP (last 3 results) No results for input(s): PROBNP in the last 8760 hours.  CBG: No results for input(s): GLUCAP in the last 168 hours.  Recent Results (from the past 240 hour(s))  Gastrointestinal Panel by PCR , Stool     Status: Abnormal   Collection Time: 04/30/15  5:17 AM  Result Value Ref Range Status   Campylobacter species NOT DETECTED NOT DETECTED Final   Plesimonas shigelloides NOT DETECTED NOT DETECTED Final   Salmonella species NOT DETECTED NOT DETECTED Final   Yersinia enterocolitica NOT DETECTED NOT DETECTED Final   Vibrio species NOT DETECTED NOT DETECTED Final   Vibrio cholerae NOT DETECTED NOT DETECTED Final   Enteroaggregative E coli (EAEC) NOT  DETECTED NOT DETECTED Final   Enteropathogenic E coli (EPEC) NOT DETECTED NOT DETECTED Final   Enterotoxigenic E coli (ETEC) NOT DETECTED NOT DETECTED Final   Shiga like toxin producing E coli (STEC) NOT DETECTED NOT DETECTED Final   E. coli O157 NOT DETECTED NOT DETECTED Final   Shigella/Enteroinvasive E coli (EIEC) NOT DETECTED NOT DETECTED Final   Cryptosporidium NOT DETECTED NOT DETECTED Final   Cyclospora cayetanensis NOT DETECTED NOT DETECTED Final   Entamoeba histolytica NOT DETECTED NOT DETECTED Final   Giardia lamblia NOT DETECTED NOT DETECTED Final   Adenovirus F40/41 NOT DETECTED NOT DETECTED Final   Astrovirus NOT DETECTED NOT DETECTED Final   Norovirus GI/GII DETECTED (A) NOT DETECTED Final    Comment: CRITICAL RESULT CALLED TO, READ BACK BY AND VERIFIED WITH: JAY SCOTTON 04/30/15 1453 MLM RESULT CALLED TO, READ BACK BY AND VERIFIED WITH: ELSIE MACABUAG,RN [id number] @ 1501 BY J SCOTTON    Rotavirus A NOT DETECTED NOT DETECTED Final   Sapovirus (I, II, IV, and V) NOT DETECTED NOT DETECTED Final  C difficile quick scan w PCR reflex     Status: None   Collection Time: 04/30/15  5:17 AM  Result Value Ref Range Status   C Diff antigen NEGATIVE NEGATIVE Final   C Diff toxin NEGATIVE NEGATIVE Final   C Diff interpretation Negative for toxigenic C. difficile  Final     Studies: Ct Abdomen Pelvis W Contrast  04/29/2015  CLINICAL DATA:  Coughing and vomiting beginning at 0300 hours, abdominal pain, dry heaving, history Down syndrome EXAM: CT ABDOMEN AND PELVIS WITH CONTRAST TECHNIQUE: Multidetector CT imaging of the abdomen and pelvis was performed using the standard protocol following bolus administration of intravenous contrast. Sagittal and coronal MPR images reconstructed from axial data set. CONTRAST:  OMNIPAQUE IOHEXOL 300 MG/ML SOLN IV. Oral contrast was not administered. COMPARISON:  03/20/2015, 03/31/2010 FINDINGS: Lung bases clear. 6 mm low-attenuation focus RIGHT lobe  liver image 31 unchanged. Focal fatty infiltration of liver adjacent to falciform fissure. Small lipoma at pancreatic tail 10 mm greatest diameter 6 unchanged. Liver, spleen, pancreas, kidneys, and adrenal glands otherwise normal. Normal appendix. Bladder wall prominent though suspect artifact related to underdistention Stomach and small bowel loops normal appearance for technique. Colon is u opacified and under distended though there is questionably mild hyperemia of the sigmoid mesocolon, cannot exclude subtle colitis. No mass, adenopathy, free air or free fluid. Osseous structures unremarkable. IMPRESSION: Small lipoma the pancreatic tail. Stable nonspecific low-attenuation lesion RIGHT lobe liver. Unable to completely exclude subtle sigmoid colitis though this could be an artifact from underdistention. Electronically Signed  By: Ulyses Southward M.D.   On: 04/29/2015 12:55   Dg Abd Acute W/chest  04/29/2015  CLINICAL DATA:  Cough, nausea and vomiting beginning this morning. History of down syndrome. EXAM: DG ABDOMEN ACUTE W/ 1V CHEST COMPARISON:  Chest radiograph March 19, 2014 FINDINGS: Cardiomediastinal silhouette is normal. Similar mild bronchitic changes. Lungs are clear, no pleural effusions. No pneumothorax. Soft tissue planes and included osseous structures are unremarkable. Paucity of bowel gas, visualized bowel gas pattern is nondilated and nonobstructive. No intra-abdominal mass effect, pathologic calcifications or free air. Soft tissue planes and included osseous structures are non-suspicious. IMPRESSION: Similar mild bronchitic changes. Paucity of bowel gas. Electronically Signed   By: Awilda Metro M.D.   On: 04/29/2015 05:54    Scheduled Meds:  Scheduled Meds: . enoxaparin (LOVENOX) injection  40 mg Subcutaneous Q24H   Continuous Infusions: . sodium chloride 75 mL/hr at 04/30/15 1610    Time spent on care of this patient: 35 min   Ameliah Baskins, MD 04/30/2015, 5:10 PM    [facility] Office  [phone] Pager - Text Page per [email or web address] If 7PM-7AM, please contact night-coverage [email or web address]

## 2015-05-01 DIAGNOSIS — A0811 Acute gastroenteropathy due to Norwalk agent: Secondary | ICD-10-CM | POA: Diagnosis not present

## 2015-05-01 DIAGNOSIS — Q909 Down syndrome, unspecified: Secondary | ICD-10-CM | POA: Diagnosis not present

## 2015-05-01 DIAGNOSIS — R1110 Vomiting, unspecified: Secondary | ICD-10-CM | POA: Diagnosis not present

## 2015-05-01 DIAGNOSIS — R197 Diarrhea, unspecified: Secondary | ICD-10-CM | POA: Diagnosis not present

## 2015-05-01 NOTE — Discharge Summary (Signed)
Physician Discharge Summary  Levi Powell NFA:[id number] DOB: 1985-06-12 DOA: 04/29/2015  PCP: Jamal Collin, PA-C  Admit date: 04/29/2015 Discharge date: 05/01/2015  Recommendations for Outpatient Follow-up:  1. Pt will need to follow up with PCP in 2 weeks post discharge 2. Please obtain BMP in one week Discharge Diagnoses:   Intractable vomiting/ Diarrhea / leukocytosis-Norovirus gastroenteritis - Secondary to Norovirus gastroenteritis - CT abd/pelvis shows "mild hyperemia of the sigmoid mesocolon" - C diff negative- GI pathogen panel positive for Norovirus - Patient was treated conservatively with intravenous fluids and bowel rest -Diet was gradually advanced which the patient tolerated  -diarrhea improved -WBC normalized  -Electrolytes optimized   Down syndrome/ --parents in room able to communicate with him  Discharge Condition:  Stable  Disposition: Home  Diet:soft Wt Readings from Last 3 Encounters:  03/21/15 75.297 kg (166 lb)    History of present illness:  30 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 The patient was clinically dehydrated. He was started on bowel rest initially and his fluids. He showed clinical improvement. His diet was gradually advanced. The patient was recently discharged from the hospital on 03/22/2015 after treatment for sepsis secondary to cystitis. The patient's diarrhea and vomiting improved. His diet was advanced which he tolerated.  Discharge Exam: Filed Vitals:   04/30/15 2100 05/01/15 0545  BP: 112/73 151/75  Pulse: 96 62  Temp: 98.2 F (36.8 C) 98.5 F (36.9 C)  Resp: 18 18   Filed Vitals:   04/30/15 0506 04/30/15 1429 04/30/15 2100 05/01/15 0545  BP: 112/67 128/76 112/73 151/75  Pulse: 79 88 96 62  Temp: 98.2 F (36.8 C) 98.4 F (36.9 C) 98.2 F (36.8 C) 98.5 F (36.9 C)  TempSrc: Oral Oral Oral Oral  Resp: SpO2: 98%  100% 97% 98%   General: Awake and alert NAD, pleasant, cooperative Cardiovascular: RRR, no rub, no gallop, no S3 Respiratory: CTAB, no wheeze, no rhonchi Abdomen:soft, nontender, nondistended, positive bowel sounds Extremities: No edema, No lymphangitis, no petechiae  Discharge Instructions     Medication List    STOP taking these medications        ciprofloxacin 500 MG tablet  Commonly known as:  CIPRO      TAKE these medications        acetaminophen 325 MG tablet  Commonly known as:  TYLENOL  Take 2 tablets (650 mg total) by mouth every 6 (six) hours as needed for mild pain (or Fever >/= 101).     omeprazole 20 MG capsule  Commonly known as:  PRILOSEC  Take 20 mg by mouth daily.         The results of significant diagnostics from this hospitalization (including imaging, microbiology, ancillary and laboratory) are listed below for reference.    Significant Diagnostic Studies: Ct Abdomen Pelvis W Contrast  04/29/2015  CLINICAL DATA:  Coughing and vomiting beginning at 0300 hours, abdominal pain, dry heaving, history Down syndrome EXAM: CT ABDOMEN AND PELVIS WITH CONTRAST TECHNIQUE: Multidetector CT imaging of the abdomen and pelvis was performed using the standard protocol following bolus administration of intravenous contrast. Sagittal and coronal MPR images reconstructed from axial data set. CONTRAST:  OMNIPAQUE IOHEXOL 300 MG/ML SOLN IV. Oral contrast was not administered. COMPARISON:  03/20/2015, 03/31/2010 FINDINGS: Lung bases clear. 6 mm low-attenuation focus RIGHT lobe liver image 31 unchanged. Focal fatty infiltration of liver adjacent to falciform fissure. Small  lipoma at pancreatic tail 10 mm greatest diameter 6 unchanged. Liver, spleen, pancreas, kidneys, and adrenal glands otherwise normal. Normal appendix. Bladder wall prominent though suspect artifact related to underdistention Stomach and small bowel loops normal appearance for technique. Colon is u  opacified and under distended though there is questionably mild hyperemia of the sigmoid mesocolon, cannot exclude subtle colitis. No mass, adenopathy, free air or free fluid. Osseous structures unremarkable. IMPRESSION: Small lipoma the pancreatic tail. Stable nonspecific low-attenuation lesion RIGHT lobe liver. Unable to completely exclude subtle sigmoid colitis though this could be an artifact from underdistention. Electronically Signed   By: Ulyses Southward M.D.   On: 04/29/2015 12:55   Dg Abd Acute W/chest  04/29/2015  CLINICAL DATA:  Cough, nausea and vomiting beginning this morning. History of down syndrome. EXAM: DG ABDOMEN ACUTE W/ 1V CHEST COMPARISON:  Chest radiograph March 19, 2014 FINDINGS: Cardiomediastinal silhouette is normal. Similar mild bronchitic changes. Lungs are clear, no pleural effusions. No pneumothorax. Soft tissue planes and included osseous structures are unremarkable. Paucity of bowel gas, visualized bowel gas pattern is nondilated and nonobstructive. No intra-abdominal mass effect, pathologic calcifications or free air. Soft tissue planes and included osseous structures are non-suspicious. IMPRESSION: Similar mild bronchitic changes. Paucity of bowel gas. Electronically Signed   By: Awilda Metro M.D.   On: 04/29/2015 05:54     Microbiology: Recent Results (from the past 240 hour(s))  Gastrointestinal Panel by PCR , Stool     Status: Abnormal   Collection Time: 04/30/15  5:17 AM  Result Value Ref Range Status   Campylobacter species NOT DETECTED NOT DETECTED Final   Plesimonas shigelloides NOT DETECTED NOT DETECTED Final   Salmonella species NOT DETECTED NOT DETECTED Final   Yersinia enterocolitica NOT DETECTED NOT DETECTED Final   Vibrio species NOT DETECTED NOT DETECTED Final   Vibrio cholerae NOT DETECTED NOT DETECTED Final   Enteroaggregative E coli (EAEC) NOT DETECTED NOT DETECTED Final   Enteropathogenic E coli (EPEC) NOT DETECTED NOT DETECTED Final    Enterotoxigenic E coli (ETEC) NOT DETECTED NOT DETECTED Final   Shiga like toxin producing E coli (STEC) NOT DETECTED NOT DETECTED Final   E. coli O157 NOT DETECTED NOT DETECTED Final   Shigella/Enteroinvasive E coli (EIEC) NOT DETECTED NOT DETECTED Final   Cryptosporidium NOT DETECTED NOT DETECTED Final   Cyclospora cayetanensis NOT DETECTED NOT DETECTED Final   Entamoeba histolytica NOT DETECTED NOT DETECTED Final   Giardia lamblia NOT DETECTED NOT DETECTED Final   Adenovirus F40/41 NOT DETECTED NOT DETECTED Final   Astrovirus NOT DETECTED NOT DETECTED Final   Norovirus GI/GII DETECTED (A) NOT DETECTED Final    Comment: CRITICAL RESULT CALLED TO, READ BACK BY AND VERIFIED WITH: JAY SCOTTON 04/30/15 1453 MLM RESULT CALLED TO, READ BACK BY AND VERIFIED WITH: ELSIE MACABUAG,RN [id number] @ 1501 BY J SCOTTON    Rotavirus A NOT DETECTED NOT DETECTED Final   Sapovirus (I, II, IV, and V) NOT DETECTED NOT DETECTED Final  C difficile quick scan w PCR reflex     Status: None   Collection Time: 04/30/15  5:17 AM  Result Value Ref Range Status   C Diff antigen NEGATIVE NEGATIVE Final   C Diff toxin NEGATIVE NEGATIVE Final   C Diff interpretation Negative for toxigenic C. difficile  Final     Labs: Basic Metabolic Panel:  Recent Labs Lab 04/29/15 0546 04/30/15 0325  NA 142 141  K 3.7 3.7  CL 105 108  CO2 24 25  GLUCOSE 154* 99  BUN 17 13  CREATININE 1.25* 1.26*  CALCIUM 9.1 8.2*   Liver Function Tests:  Recent Labs Lab 04/29/15 0546 04/30/15 0325  AST 23 17  ALT 17 14*  ALKPHOS 70 46  BILITOT 0.8 0.7  PROT 7.4 5.8*  ALBUMIN 4.3 3.3*    Recent Labs Lab 04/29/15 0546  LIPASE 28   No results for input(s): AMMONIA in the last 168 hours. CBC:  Recent Labs Lab 04/29/15 0546 04/30/15 0325  WBC 14.9* 6.8  NEUTROABS 13.5*  --   HGB 14.1 12.2*  HCT 42.8 37.7*  MCV 95.5 96.4  PLT 271 199   Cardiac Enzymes: No results for input(s): CKTOTAL, CKMB, CKMBINDEX, TROPONINI  in the last 168 hours. BNP: Invalid input(s): POCBNP CBG: No results for input(s): GLUCAP in the last 168 hours.  Time coordinating discharge:  Greater than 30 minutes  Signed:  Malyn Aytes, DO [facility] Pager: [phone] 05/01/2015, 10:15 AM

## 2015-05-01 NOTE — Care Management Note (Signed)
Case Management Note  Patient Details  Name: Levi Powell MRN: [id number] Date of Birth: 03/14/1985  Subjective/Objective:                    Action/Plan:d/c SNF.   Expected Discharge Date:   (unknown)               Expected Discharge Plan:  Home/Self Care  In-House Referral:     Discharge planning Services  CM Consult  Post Acute Care Choice:    Choice offered to:     DME Arranged:    DME Agency:     HH Arranged:    HH Agency:     Status of Service:  Completed, signed off  Medicare Important Message Given:    Date Medicare IM Given:    Medicare IM give by:    Date Additional Medicare IM Given:    Additional Medicare Important Message give by:     If discussed at Long Length of Stay Meetings, dates discussed:    Additional Comments:  Lanier Clam, RN 05/01/2015, 10:46 AM

## 2015-05-01 NOTE — Care Management Note (Signed)
Case Management Note  Patient Details  Name: ELSWORTH LEDIN MRN: [id number] Date of Birth: 1985-03-28  Subjective/Objective:  Received call from [facility] to talk to patient's father about OBSERVATION status form.  Once explained father voiced understanding. No further d/c needs.                  Action/Plan:d/c home.   Expected Discharge Date:   (unknown)               Expected Discharge Plan:  Home/Self Care  In-House Referral:     Discharge planning Services  CM Consult  Post Acute Care Choice:    Choice offered to:     DME Arranged:    DME Agency:     HH Arranged:    HH Agency:     Status of Service:  Completed, signed off  Medicare Important Message Given:    Date Medicare IM Given:    Medicare IM give by:    Date Additional Medicare IM Given:    Additional Medicare Important Message give by:     If discussed at Long Length of Stay Meetings, dates discussed:    Additional Comments:  Lanier Clam, RN 05/01/2015, 1:59 PM

## 2016-01-07 ENCOUNTER — Emergency Department (HOSPITAL_COMMUNITY)
Admission: EM | Admit: 2016-01-07 | Discharge: 2016-01-07 | Disposition: A | Discharge status: Home / Self Care | Payer: Medicare Other | Attending: Emergency Medicine | Admitting: Emergency Medicine

## 2016-01-07 ENCOUNTER — Emergency Department (HOSPITAL_COMMUNITY): Payer: Medicare Other

## 2016-01-07 ENCOUNTER — Encounter (HOSPITAL_COMMUNITY): Payer: Self-pay | Admitting: *Deleted

## 2016-01-07 DIAGNOSIS — J029 Acute pharyngitis, unspecified: Secondary | ICD-10-CM | POA: Diagnosis present

## 2016-01-07 DIAGNOSIS — E860 Dehydration: Secondary | ICD-10-CM | POA: Diagnosis not present

## 2016-01-07 DIAGNOSIS — J40 Bronchitis, not specified as acute or chronic: Secondary | ICD-10-CM

## 2016-01-07 LAB — CBC
HEMATOCRIT: 40.7 % (ref 39.0–52.0)
HEMOGLOBIN: 13.9 g/dL (ref 13.0–17.0)
MCH: 31.7 pg (ref 26.0–34.0)
MCHC: 34.2 g/dL (ref 30.0–36.0)
MCV: 92.9 fL (ref 78.0–100.0)
Platelets: 275 10*3/uL (ref 150–400)
RBC: 4.38 MIL/uL (ref 4.22–5.81)
RDW: 13.9 % (ref 11.5–15.5)
WBC: 15.9 10*3/uL — AB (ref 4.0–10.5)

## 2016-01-07 LAB — URINALYSIS, ROUTINE W REFLEX MICROSCOPIC
GLUCOSE, UA: NEGATIVE mg/dL
Hgb urine dipstick: NEGATIVE
Ketones, ur: 15 mg/dL — AB
LEUKOCYTES UA: NEGATIVE
NITRITE: NEGATIVE
PH: 7 (ref 5.0–8.0)
Protein, ur: NEGATIVE mg/dL
SPECIFIC GRAVITY, URINE: 1.03 (ref 1.005–1.030)

## 2016-01-07 LAB — COMPREHENSIVE METABOLIC PANEL
ALBUMIN: 3.9 g/dL (ref 3.5–5.0)
ALT: 14 U/L — ABNORMAL LOW (ref 17–63)
ANION GAP: 7 (ref 5–15)
AST: 16 U/L (ref 15–41)
Alkaline Phosphatase: 63 U/L (ref 38–126)
BILIRUBIN TOTAL: 1 mg/dL (ref 0.3–1.2)
BUN: 14 mg/dL (ref 6–20)
CHLORIDE: 105 mmol/L (ref 101–111)
CO2: 26 mmol/L (ref 22–32)
Calcium: 8.9 mg/dL (ref 8.9–10.3)
Creatinine, Ser: 1.16 mg/dL (ref 0.61–1.24)
GFR calc Af Amer: >60 mL/min (ref >60)
GLUCOSE: 106 mg/dL — AB (ref 65–99)
POTASSIUM: 3.9 mmol/L (ref 3.5–5.1)
Sodium: 138 mmol/L (ref 135–145)
TOTAL PROTEIN: 7.8 g/dL (ref 6.5–8.1)

## 2016-01-07 LAB — MONONUCLEOSIS SCREEN: Mono Screen: NEGATIVE

## 2016-01-07 LAB — LIPASE, BLOOD: LIPASE: 18 U/L (ref 11–51)

## 2016-01-07 LAB — RAPID STREP SCREEN (MED CTR MEBANE ONLY): STREPTOCOCCUS, GROUP A SCREEN (DIRECT): NEGATIVE

## 2016-01-07 MED ORDER — ONDANSETRON HCL 8 MG PO TABS
8.0000 mg | ORAL_TABLET | Freq: Three times a day (TID) | ORAL | 0 refills | Status: DC | PRN
Start: 2016-01-07 — End: 2016-07-31

## 2016-01-07 MED ORDER — ONDANSETRON HCL 4 MG/2ML IJ SOLN
4.0000 mg | Freq: Once | INTRAMUSCULAR | Status: AC
  Administered 2016-01-07: 4 mg via INTRAVENOUS
  Filled 2016-01-07: qty 2

## 2016-01-07 MED ORDER — AZITHROMYCIN 250 MG PO TABS: 250.0000 mg | ORAL_TABLET | Freq: Every day | ORAL | 0 refills | Status: DC

## 2016-01-07 MED ORDER — ONDANSETRON 4 MG PO TBDP
4.0000 mg | ORAL_TABLET | Freq: Once | ORAL | Status: AC | PRN
  Administered 2016-01-07: 4 mg via ORAL
  Filled 2016-01-07: qty 1

## 2016-01-07 MED ORDER — SODIUM CHLORIDE 0.9 % IV BOLUS (SEPSIS)
1000.0000 mL | Freq: Once | INTRAVENOUS | Status: AC
  Administered 2016-01-07: 1000 mL via INTRAVENOUS

## 2016-01-07 MED ORDER — PREDNISONE 10 MG PO TABS: 20.0000 mg | ORAL_TABLET | Freq: Every day | ORAL | 0 refills | Status: DC

## 2016-01-07 MED ORDER — DEXAMETHASONE SODIUM PHOSPHATE 10 MG/ML IJ SOLN
10.0000 mg | Freq: Once | INTRAMUSCULAR | Status: AC
  Administered 2016-01-07: 10 mg via INTRAVENOUS
  Filled 2016-01-07: qty 1

## 2016-01-07 NOTE — ED Triage Notes (Signed)
Per father, pt has vomited 3 times today and appears more lethargic than usual. Father states pt had looser stools than normal. Father states the pt points to his throat as if he is in pain.

## 2016-01-07 NOTE — Discharge Instructions (Signed)
Tests were good.   Medication for antibiotic, prednisone, nausea.  Follow up your dr.

## 2016-01-07 NOTE — ED Notes (Signed)
Pt transported to CT scan.

## 2016-01-07 NOTE — ED Provider Notes (Signed)
[facility]-EMERGENCY DEPT Provider Note   CSN: [id number] Arrival date & time: 01/07/16  1504     History   Chief Complaint Chief Complaint  Patient presents with  . Emesis    HPI Levi Powell is a 30 y.o. male.  Level V caveat for down syndrome. History obtained from father. Patient has vomited several times today and he is dehydrated. He is more lethargic than usual. Review systems positive for sore throat and cough along with diarrhea. No stiff neck or neurological deficits. Low-grade fever.      Past Medical History:  Diagnosis Date  . Down syndrome     Patient Active Problem List   Diagnosis Date Noted  . Gastroenteritis due to norovirus 04/30/2015  . Intractable vomiting 04/29/2015  . Diarrhea 04/29/2015  . Down syndrome 04/29/2015  . Vomiting 03/20/2015  . 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 Cystitis 03/20/2015  . UTI (lower urinary tract infection) 03/20/2015  . Sepsis, unspecified organism (HCC) 03/20/2015  . Cough     Past Surgical History:  Procedure Laterality Date  . MYRINGOTOMY    . NO PAST SURGERIES         Home Medications    Prior to Admission medications   Medication Sig Start Date End Date Taking? Authorizing Provider  Cyanocobalamin (VITAMIN B-12) 1000 MCG/15ML LIQD Take 1,000 mcg by mouth daily.   Yes Historical Provider, MD  omeprazole (PRILOSEC) 20 MG capsule Take 20 mg by mouth daily. 04/09/15  Yes Historical Provider, MD  acetaminophen (TYLENOL) 325 MG tablet Take 2 tablets (650 mg total) by mouth every 6 (six) hours as needed for mild pain (or Fever >/= 101). Patient not taking: Reported on 01/07/2016 03/22/15   Belkys A Regalado, MD  azithromycin (ZITHROMAX) 250 MG tablet Take 1 tablet (250 mg total) by mouth daily. Take first 2 tablets together, then 1 every day until finished. 01/07/16   Donnetta HutchingBrian Demetruis Depaul, MD  ondansetron (ZOFRAN) 8 MG tablet Take 1 tablet (8 mg total) by mouth every 8 (eight) hours as needed for nausea or vomiting. 01/07/16   Donnetta HutchingBrian  Gurdeep Keesey, MD  predniSONE (DELTASONE) 10 MG tablet Take 2 tablets (20 mg total) by mouth daily. 01/07/16   Donnetta HutchingBrian Kareli Hossain, MD    Family History Family History  Problem Relation Age of Onset  . Hypertension Mother   . Cancer Mother     Social History Social History  Substance Use Topics  . Smoking status: Never Smoker  . Smokeless tobacco: Never Used  . Alcohol use No     Allergies   Patient has no known allergies.   Review of Systems Review of Systems  Reason unable to perform ROS: Down syndrome.     Physical Exam Updated Vital Signs BP 117/72 (BP Location: Left Arm)   Pulse 86   Temp 98 F (36.7 C) (Oral)   Resp 19   SpO2 92%   Physical Exam  Constitutional:  Physical characteristics consistent with Down syndrome; Appears dehydrated  HENT:  Head: Normocephalic and atraumatic.  Throat erythematous, no peritonsillar abscess  Eyes: Conjunctivae are normal.  Neck: Neck supple.  Cardiovascular: Normal rate and regular rhythm.   Pulmonary/Chest: Effort normal and breath sounds normal.  Abdominal: Soft. Bowel sounds are normal.  Musculoskeletal: Normal range of motion.  Neurological: He is alert.  Skin: Skin is warm and dry.  Psychiatric: He has a normal mood and affect. His behavior is normal.  Normal for patient's norm  Nursing note and vitals reviewed.  ED Treatments / Results  Labs (all labs ordered are listed, but only abnormal results are displayed) Labs Reviewed  COMPREHENSIVE METABOLIC PANEL - Abnormal; Notable for the following:       Result Value   Glucose, Bld 106 (*)    ALT 14 (*)    All other components within normal limits  CBC - Abnormal; Notable for the following:    WBC 15.9 (*)    All other components within normal limits  URINALYSIS, ROUTINE W REFLEX MICROSCOPIC (NOT AT [facility]) - Abnormal; Notable for the following:    Bilirubin Urine SMALL (*)    Ketones, ur 15 (*)    All other components within normal limits  RAPID STREP SCREEN (NOT AT  [facility])  CULTURE, GROUP A STREP ([facility])  LIPASE, BLOOD  MONONUCLEOSIS SCREEN    EKG  EKG Interpretation None       Radiology Dg Chest 2 View  Result Date: 01/07/2016 CLINICAL DATA:  Cough for 2 weeks and emesis onset today. EXAM: CHEST  2 VIEW COMPARISON:  04/29/2015 FINDINGS: The heart is top-normal in size. No aortic aneurysm. No pneumonic consolidation. Mild increase in interstitial prominence with peribronchial thickening suggesting bronchitic change as before. No effusion. Pulmonary vasculature is unremarkable. IMPRESSION: Stable mild interstitial prominence with bronchial thickening consistent bronchitic change. No pneumonic consolidation. Electronically Signed   By: Tollie Ethavid  Kwon M.D.   On: 01/07/2016 21:30    Procedures Procedures (including critical care time)  Medications Ordered in ED Medications  ondansetron (ZOFRAN-ODT) disintegrating tablet 4 mg (4 mg Oral Given 01/07/16 1523)  ondansetron (ZOFRAN) injection 4 mg (4 mg Intravenous Given 01/07/16 1834)  sodium chloride 0.9 % bolus 1,000 mL (0 mLs Intravenous Stopped 01/07/16 2110)  dexamethasone (DECADRON) injection 10 mg (10 mg Intravenous Given 01/07/16 1834)  sodium chloride 0.9 % bolus 1,000 mL (0 mLs Intravenous Stopped 01/07/16 2110)  sodium chloride 0.9 % bolus 1,000 mL (1,000 mLs Intravenous New Bag/Given 01/07/16 2123)     Initial Impression / Assessment and Plan / ED Course  I have reviewed the triage vital signs and the nursing notes.  Pertinent labs & imaging results that were available during my care of the patient were reviewed by me and considered in my medical decision making (see chart for details).  Clinical Course     Patient appears dehydrated. Screening tests essentially normal with exception of possible bronchitis on x-ray. He feels much better p 3 L of IV fluid, IV steroids, IV Zofran.  Patient is immunocompromised. Discharge medications Zithromax, Zofran 8 mg, prednisone  Final Clinical  Impressions(s) / ED Diagnoses   Final diagnoses:  Bronchitis    New Prescriptions New Prescriptions   AZITHROMYCIN (ZITHROMAX) 250 MG TABLET    Take 1 tablet (250 mg total) by mouth daily. Take first 2 tablets together, then 1 every day until finished.   ONDANSETRON (ZOFRAN) 8 MG TABLET    Take 1 tablet (8 mg total) by mouth every 8 (eight) hours as needed for nausea or vomiting.   PREDNISONE (DELTASONE) 10 MG TABLET    Take 2 tablets (20 mg total) by mouth daily.     Donnetta HutchingBrian Lindsie Simar, MD 01/07/16 2306

## 2016-01-07 NOTE — ED Notes (Signed)
ED Provider at bedside. 

## 2016-01-10 LAB — CULTURE, GROUP A STREP (THRC)

## 2016-05-03 ENCOUNTER — Emergency Department (HOSPITAL_COMMUNITY): Payer: Medicare Other

## 2016-05-03 ENCOUNTER — Encounter (HOSPITAL_COMMUNITY): Payer: Self-pay

## 2016-05-03 ENCOUNTER — Emergency Department (HOSPITAL_COMMUNITY)
Admission: EM | Admit: 2016-05-03 | Discharge: 2016-05-03 | Disposition: A | Discharge status: Home / Self Care | Payer: Medicare Other | Attending: Emergency Medicine | Admitting: Emergency Medicine

## 2016-05-03 DIAGNOSIS — Z79899 Other long term (current) drug therapy: Secondary | ICD-10-CM | POA: Insufficient documentation

## 2016-05-03 DIAGNOSIS — R109 Unspecified abdominal pain: Secondary | ICD-10-CM | POA: Diagnosis not present

## 2016-05-03 DIAGNOSIS — R112 Nausea with vomiting, unspecified: Secondary | ICD-10-CM | POA: Diagnosis present

## 2016-05-03 DIAGNOSIS — R197 Diarrhea, unspecified: Secondary | ICD-10-CM | POA: Diagnosis not present

## 2016-05-03 LAB — URINALYSIS, ROUTINE W REFLEX MICROSCOPIC
BILIRUBIN URINE: NEGATIVE
GLUCOSE, UA: NEGATIVE mg/dL
HGB URINE DIPSTICK: NEGATIVE
Ketones, ur: NEGATIVE mg/dL
Leukocytes, UA: NEGATIVE
Nitrite: NEGATIVE
PROTEIN: NEGATIVE mg/dL
Specific Gravity, Urine: 1.02 (ref 1.005–1.030)
pH: 6 (ref 5.0–8.0)

## 2016-05-03 LAB — CBC
HCT: 43 % (ref 39.0–52.0)
Hemoglobin: 15 g/dL (ref 13.0–17.0)
MCH: 32.1 pg (ref 26.0–34.0)
MCHC: 34.9 g/dL (ref 30.0–36.0)
MCV: 92.1 fL (ref 78.0–100.0)
PLATELETS: 267 10*3/uL (ref 150–400)
RBC: 4.67 MIL/uL (ref 4.22–5.81)
RDW: 14.4 % (ref 11.5–15.5)
WBC: 18 10*3/uL — ABNORMAL HIGH (ref 4.0–10.5)

## 2016-05-03 LAB — COMPREHENSIVE METABOLIC PANEL
ALBUMIN: 4 g/dL (ref 3.5–5.0)
ALK PHOS: 68 U/L (ref 38–126)
ALT: 29 U/L (ref 17–63)
AST: 23 U/L (ref 15–41)
Anion gap: 7 (ref 5–15)
BUN: 17 mg/dL (ref 6–20)
CALCIUM: 8.9 mg/dL (ref 8.9–10.3)
CHLORIDE: 105 mmol/L (ref 101–111)
CO2: 27 mmol/L (ref 22–32)
CREATININE: 1.18 mg/dL (ref 0.61–1.24)
GFR calc non Af Amer: >60 mL/min (ref >60)
GLUCOSE: 97 mg/dL (ref 65–99)
Potassium: 3.7 mmol/L (ref 3.5–5.1)
SODIUM: 139 mmol/L (ref 135–145)
Total Bilirubin: 0.8 mg/dL (ref 0.3–1.2)
Total Protein: 7.4 g/dL (ref 6.5–8.1)

## 2016-05-03 LAB — RAPID STREP SCREEN (MED CTR MEBANE ONLY): Streptococcus, Group A Screen (Direct): NEGATIVE

## 2016-05-03 LAB — LIPASE, BLOOD: LIPASE: 33 U/L (ref 11–51)

## 2016-05-03 MED ORDER — IOPAMIDOL (ISOVUE-300) INJECTION 61%
INTRAVENOUS | Status: AC
  Administered 2016-05-03: 100 mL
  Filled 2016-05-03: qty 100

## 2016-05-03 MED ORDER — ONDANSETRON 8 MG PO TBDP: 4.0000 mg | ORAL_TABLET | Freq: Three times a day (TID) | ORAL | 0 refills | Status: DC | PRN

## 2016-05-03 MED ORDER — BENZONATATE 100 MG PO CAPS
100.0000 mg | ORAL_CAPSULE | Freq: Once | ORAL | Status: DC
  Filled 2016-05-03: qty 1

## 2016-05-03 MED ORDER — SODIUM CHLORIDE 0.9 % IV BOLUS (SEPSIS)
1000.0000 mL | Freq: Once | INTRAVENOUS | Status: AC
  Administered 2016-05-03: 1000 mL via INTRAVENOUS

## 2016-05-03 MED ORDER — ONDANSETRON HCL 4 MG/2ML IJ SOLN
4.0000 mg | Freq: Once | INTRAMUSCULAR | Status: AC
  Administered 2016-05-03: 4 mg via INTRAVENOUS
  Filled 2016-05-03: qty 2

## 2016-05-03 MED ORDER — ONDANSETRON 4 MG PO TBDP
4.0000 mg | ORAL_TABLET | Freq: Once | ORAL | Status: AC | PRN
  Administered 2016-05-03: 4 mg via ORAL
  Filled 2016-05-03: qty 1

## 2016-05-03 NOTE — Discharge Instructions (Signed)
Please increase his acid medication to 2 times a day for 7 days. Follow-up with his GI doctor. All of his lab work and imaging has been normal today. Please use Zofran for nausea. Make sure he should complaining of fluids. Would use over-the-counter Robitussin or Mucinex for cough and congestion. Follow-up with his primary care doctor if symptoms not improved this week or return to the ED or their worsened.

## 2016-05-03 NOTE — ED Provider Notes (Signed)
[facility]-EMERGENCY DEPT Provider Note   CSN: [id number] Arrival date & time: 05/03/16  1112     History   Chief Complaint Chief Complaint  Patient presents with  . Emesis  . Diarrhea    HPI Levi Powell is a 31 y.o. male.  HPI 31 year old Hispanic male with a mask medical history significant for Down syndrome comes the ED with his parents with complaints of n/V/D that started last night. Father states that patient has been coughing and vomits after coughing. States that he did eat hot wings last night that may have caused his symptoms. Patient complains of the triage nurse of slight abdominal pain however he is unable to specify where with the quality of the pain is like. Patient is nonverbal during history of present illness. Father denies any fevers. He states he has complained of a sore throat. Denies any rhinorrhea or nasal congestion. Has not training for the symptoms prior to arrival. Denies any sick contacts. States that the diarrhea is nonbloody. Emesis is nonbloody. No recent travel outside the [place]. No recent antibiotics.Dad states that the cough is a dry nonproductive cough. Patient does have history of acid reflux and is on Prilosec.   Past Medical History:  Diagnosis Date  . Down syndrome     Patient Active Problem List   Diagnosis Date Noted  . Gastroenteritis due to norovirus 04/30/2015  . Intractable vomiting 04/29/2015  . Diarrhea 04/29/2015  . Down syndrome 04/29/2015  . Vomiting 03/20/2015  . 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 Cystitis 03/20/2015  . UTI (lower urinary tract infection) 03/20/2015  . Sepsis, unspecified organism (HCC) 03/20/2015  . Cough     Past Surgical History:  Procedure Laterality Date  . MYRINGOTOMY    . NO PAST SURGERIES         Home Medications    Prior to Admission medications   Medication Sig Start Date End Date Taking? Authorizing Provider  acetaminophen (TYLENOL) 325 MG tablet Take 2 tablets (650 mg total) by mouth every 6  (six) hours as needed for mild pain (or Fever >/= 101). Patient not taking: Reported on 01/07/2016 03/22/15   Belkys A Regalado, MD  azithromycin (ZITHROMAX) 250 MG tablet Take 1 tablet (250 mg total) by mouth daily. Take first 2 tablets together, then 1 every day until finished. 01/07/16   Donnetta Hutching, MD  Cyanocobalamin (VITAMIN B-12) 1000 MCG/15ML LIQD Take 1,000 mcg by mouth daily.    Historical Provider, MD  omeprazole (PRILOSEC) 20 MG capsule Take 20 mg by mouth daily. 04/09/15   Historical Provider, MD  ondansetron (ZOFRAN) 8 MG tablet Take 1 tablet (8 mg total) by mouth every 8 (eight) hours as needed for nausea or vomiting. 01/07/16   Donnetta Hutching, MD  predniSONE (DELTASONE) 10 MG tablet Take 2 tablets (20 mg total) by mouth daily. 01/07/16   Donnetta Hutching, MD    Family History Family History  Problem Relation Age of Onset  . Hypertension Mother   . Cancer Mother     Social History Social History  Substance Use Topics  . Smoking status: Never Smoker  . Smokeless tobacco: Never Used  . Alcohol use No     Allergies   Patient has no known allergies.   Review of Systems Review of Systems  Constitutional: Negative for chills and fever.  HENT: Positive for congestion, rhinorrhea and sore throat.   Respiratory: Positive for cough. Negative for shortness of breath and wheezing.   Cardiovascular: Negative for chest pain and  palpitations.  Gastrointestinal: Positive for abdominal pain (unable to describe), diarrhea, nausea and vomiting. Negative for blood in stool and constipation.  Genitourinary: Negative for dysuria, frequency, hematuria and urgency.  Skin: Negative for color change.  Neurological: Negative for dizziness, syncope, weakness, light-headedness, numbness and headaches.  All other systems reviewed and are negative.    Physical Exam Updated Vital Signs BP 123/77 (BP Location: Left Arm)   Pulse 70   Temp 98.2 F (36.8 C) (Oral)   Resp 16   SpO2 100%   Physical Exam    Constitutional: He appears well-developed and well-nourished. No distress.  Nontoxic-appearing.  HENT:  Head: Normocephalic and atraumatic.  Right Ear: Tympanic membrane, external ear and ear canal normal.  Left Ear: Tympanic membrane, external ear and ear canal normal.  Nose: Mucosal edema and rhinorrhea present.  Mouth/Throat: Uvula is midline, oropharynx is clear and moist and mucous membranes are normal.  Mucous membranes appear moist  Eyes: Conjunctivae are normal. Right eye exhibits no discharge. Left eye exhibits no discharge. No scleral icterus.  Neck: Normal range of motion. Neck supple. No thyromegaly present.  Cardiovascular: Normal rate, regular rhythm, normal heart sounds and intact distal pulses.  Exam reveals no gallop and no friction rub.   No murmur heard. Pulmonary/Chest: Effort normal and breath sounds normal. No respiratory distress. He has no wheezes. He has no rales.  Abdominal: Soft. Bowel sounds are normal. He exhibits no distension. There is no tenderness. There is no rebound and no guarding.  Musculoskeletal: Normal range of motion.  Lymphadenopathy:    He has no cervical adenopathy.  Neurological: He is alert.  Skin: Skin is warm and dry. Capillary refill takes less than 2 seconds.  Nursing note and vitals reviewed.    ED Treatments / Results  Labs (all labs ordered are listed, but only abnormal results are displayed) Labs Reviewed  CBC - Abnormal; Notable for the following:       Result Value   WBC 18.0 (*)    All other components within normal limits  RAPID STREP SCREEN (NOT AT [facility])  CULTURE, GROUP A STREP ([facility])  LIPASE, BLOOD  COMPREHENSIVE METABOLIC PANEL  URINALYSIS, ROUTINE W REFLEX MICROSCOPIC    EKG  EKG Interpretation None       Radiology Ct Abdomen Pelvis W Contrast  Result Date: 05/03/2016 CLINICAL DATA:  Nausea, vomiting, and diarrhea. EXAM: CT ABDOMEN AND PELVIS WITH CONTRAST TECHNIQUE: Multidetector CT imaging of the abdomen  and pelvis was performed using the standard protocol following bolus administration of intravenous contrast. CONTRAST:  ISOVUE-300 IOPAMIDOL (ISOVUE-300) INJECTION 61% COMPARISON:  April 29, 2015 FINDINGS: Lower chest: No acute abnormality. Hepatobiliary: Low-attenuation in the left hepatic lobe on series 2, image 15 is unchanged since February of 2017 and likely a benign process such is a hemangioma or small cysts. A similar region is seen in the right hepatic lobe on image 30, unchanged as well. No new suspicious hepatic lesions. The portal vein is normal. The gallbladder is unremarkable. Pancreas: Small lipoma in the pancreatic tail. The pancreas is otherwise normal. Spleen: Normal in size without focal abnormality. Adrenals/Urinary Tract: Bladder wall thickening is seen in stable with no adjacent stranding. No ureterectasis or ureteral stones. The kidneys are normal in appearance as are the adrenal glands. Stomach/Bowel: The stomach and small bowel are normal. The distal colon is poorly distended limiting evaluation but there is no convincing evidence of colitis. A few scattered colonic diverticula are seen without diverticulitis. The remainder of  the colon is normal as well. The visualized appendix is unremarkable. Vascular/Lymphatic: No significant vascular findings are present. No enlarged abdominal or pelvic lymph nodes. 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 Reproductive: Prostate is unremarkable. Other: There is a small fat containing periumbilical hernia. No other abnormalities. Musculoskeletal: No acute or significant osseous findings. IMPRESSION: 1. No acute abnormality to explain the patient's symptoms. No significant interval change. Electronically Signed   By: Gerome Samavid  Williams III M.D   On: 05/03/2016 17:13   Dg Abdomen Acute W/chest  Result Date: 05/03/2016 CLINICAL DATA:  Cough, abdominal pain, nausea, vomiting. EXAM: DG ABDOMEN ACUTE W/ 1V CHEST COMPARISON:  Radiograph of  January 07, 2016. FINDINGS: There is no evidence of dilated bowel loops or free intraperitoneal air. No radiopaque calculi or other significant radiographic abnormality is seen. Heart size and mediastinal contours are within normal limits. Both lungs are clear. IMPRESSION: No evidence of bowel obstruction or ileus. No acute cardiopulmonary disease. Electronically Signed   By: Lupita RaiderJames  Green Jr, M.D.   On: 05/03/2016 15:03    Procedures Procedures (including critical care time)  Medications Ordered in ED Medications  benzonatate (TESSALON) capsule 100 mg (100 mg Oral Not Given 05/03/16 1740)  ondansetron (ZOFRAN-ODT) disintegrating tablet 4 mg (4 mg Oral Given 05/03/16 1132)  iopamidol (ISOVUE-300) 61 % injection (100 mLs  Contrast Given 05/03/16 1639)  ondansetron (ZOFRAN) injection 4 mg (4 mg Intravenous Given 05/03/16 1739)  sodium chloride 0.9 % bolus 1,000 mL (1,000 mLs Intravenous New Bag/Given 05/03/16 1740)     Initial Impression / Assessment and Plan / ED Course  I have reviewed the triage vital signs and the nursing notes.  Pertinent labs & imaging results that were available during my care of the patient were reviewed by me and considered in my medical decision making (see chart for details).     Patient presents to the ED with complaints of in/V/D and a nonproductive cough. Patient may note of abdominal pain but unable to explain on exam. Patient is nontender without any rebound on exam. Acute abdominal x-ray with chest was performed. No acute abnormalities. Patient does have a leukocytosis of 18. Father states the patient does have rhinorrhea, cough, sore throat. The sclerae likely be due to viral illness. However given his nausea/vomiting/diarrhea will obtain CT abdomen. CT abdomen was unremarkable for any acute findings. Family requested a strep test that was negative. Patient has had 2 episodes of posttussive emesis however I have not witnessed this nor had the nurse. Patient was able  tolerate by mouth fluids without any emesis. Repeat abdominal exam is benign. Vital signs are stable. Does not meet any SIRS or sepsis criteria. Father states the patient has acid reflux. Encouraged him to increase the Prilosec to 40 mg a day for 7 days and follow up with his GI doctor. Encouraged to follow up with the primary care doctor this week if symptoms not improved. Parents verbalized understanding with the plan of care. We will give by mouth Zofran. Encouraged over-the-counter cough medicine as patient cannot swallow any Tessalon. Patient remains hemodynamically stable in no acute distress prior to discharge. States he his rate to be discharged. QUESTIONS were answered prior to discharge.  Final Clinical Impressions(s) / ED Diagnoses   Final diagnoses:  Nausea vomiting and diarrhea    New Prescriptions New Prescriptions   ONDANSETRON (ZOFRAN-ODT) 8 MG DISINTEGRATING TABLET    Take 0.5 tablets (4 mg total) by mouth every 8 (eight) hours as needed for nausea.  Rise Mu, PA-C 05/03/16 1839    Lorre Nick, MD 05/05/16 0005

## 2016-05-03 NOTE — ED Notes (Addendum)
Patient took a few sips of water about ago when RN was in room. Patient has has no nausea since. PA notified.

## 2016-05-03 NOTE — ED Triage Notes (Signed)
Pt here with n/v/d since last night.  No fever.  Slight abdominal pain.

## 2016-05-03 NOTE — ED Notes (Signed)
Patient transported to X-ray 

## 2016-05-06 LAB — CULTURE, GROUP A STREP (THRC)

## 2016-07-31 ENCOUNTER — Emergency Department (HOSPITAL_COMMUNITY)
Admission: EM | Admit: 2016-07-31 | Discharge: 2016-07-31 | Disposition: A | Discharge status: Home / Self Care | Payer: Medicare Other | Attending: Emergency Medicine | Admitting: Emergency Medicine

## 2016-07-31 ENCOUNTER — Encounter (HOSPITAL_COMMUNITY): Payer: Self-pay | Admitting: Emergency Medicine

## 2016-07-31 DIAGNOSIS — Z79899 Other long term (current) drug therapy: Secondary | ICD-10-CM | POA: Insufficient documentation

## 2016-07-31 DIAGNOSIS — K210 Gastro-esophageal reflux disease with esophagitis: Secondary | ICD-10-CM | POA: Insufficient documentation

## 2016-07-31 DIAGNOSIS — R05 Cough: Secondary | ICD-10-CM | POA: Diagnosis present

## 2016-07-31 DIAGNOSIS — K219 Gastro-esophageal reflux disease without esophagitis: Secondary | ICD-10-CM

## 2016-07-31 DIAGNOSIS — R059 Cough, unspecified: Secondary | ICD-10-CM

## 2016-07-31 LAB — URINALYSIS, ROUTINE W REFLEX MICROSCOPIC
Bilirubin Urine: NEGATIVE
Glucose, UA: NEGATIVE mg/dL
Hgb urine dipstick: NEGATIVE
KETONES UR: NEGATIVE mg/dL
Leukocytes, UA: NEGATIVE
NITRITE: NEGATIVE
PH: 7 (ref 5.0–8.0)
Protein, ur: NEGATIVE mg/dL
SPECIFIC GRAVITY, URINE: 1.011 (ref 1.005–1.030)

## 2016-07-31 LAB — COMPREHENSIVE METABOLIC PANEL
ALT: 19 U/L (ref 17–63)
AST: 22 U/L (ref 15–41)
Albumin: 4.1 g/dL (ref 3.5–5.0)
Alkaline Phosphatase: 72 U/L (ref 38–126)
Anion gap: 7 (ref 5–15)
BUN: 17 mg/dL (ref 6–20)
CHLORIDE: 104 mmol/L (ref 101–111)
CO2: 27 mmol/L (ref 22–32)
CREATININE: 1.12 mg/dL (ref 0.61–1.24)
Calcium: 8.9 mg/dL (ref 8.9–10.3)
GFR calc Af Amer: >60 mL/min (ref >60)
GFR calc non Af Amer: >60 mL/min (ref >60)
Glucose, Bld: 91 mg/dL (ref 65–99)
POTASSIUM: 3.9 mmol/L (ref 3.5–5.1)
SODIUM: 138 mmol/L (ref 135–145)
Total Bilirubin: 0.5 mg/dL (ref 0.3–1.2)
Total Protein: 7.6 g/dL (ref 6.5–8.1)

## 2016-07-31 LAB — CBC
HEMATOCRIT: 41.2 % (ref 39.0–52.0)
Hemoglobin: 14.4 g/dL (ref 13.0–17.0)
MCH: 32.7 pg (ref 26.0–34.0)
MCHC: 35 g/dL (ref 30.0–36.0)
MCV: 93.4 fL (ref 78.0–100.0)
PLATELETS: 238 10*3/uL (ref 150–400)
RBC: 4.41 MIL/uL (ref 4.22–5.81)
RDW: 13.8 % (ref 11.5–15.5)
WBC: 12.7 10*3/uL — AB (ref 4.0–10.5)

## 2016-07-31 LAB — LIPASE, BLOOD: LIPASE: 22 U/L (ref 11–51)

## 2016-07-31 MED ORDER — SUCRALFATE 1 GM/10ML PO SUSP: 1.0000 g | Freq: Two times a day (BID) | ORAL | 0 refills | Status: DC

## 2016-07-31 MED ORDER — ONDANSETRON 4 MG PO TBDP
4.0000 mg | ORAL_TABLET | Freq: Once | ORAL | Status: AC | PRN
  Administered 2016-07-31: 4 mg via ORAL
  Filled 2016-07-31: qty 1

## 2016-07-31 MED ORDER — GI COCKTAIL ~~LOC~~
30.0000 mL | Freq: Once | ORAL | Status: AC
  Administered 2016-07-31: 30 mL via ORAL
  Filled 2016-07-31: qty 30

## 2016-07-31 NOTE — ED Provider Notes (Signed)
[facility] Provider Note   CSN: [id number] Arrival date & time: 07/31/16  1021     History   Chief Complaint Chief Complaint  Patient presents with  . Emesis  . Nausea    HPI Levi Powell is a 31 y.o. male.  Patient with past medical history of GERD (on prilosec) and Down syndrome, presents with persistent dry cough x1 week. Pt's mother reports cough is worse in the morning, no modifying factors. She's mother reports occasionally patient will gag after coughing and sleepiness in the morning. Pt's parents think this cough is 2/t to his reflux. Denies shortness of breath, fever, decreased appetite. He has had nl BM with last one this morning, no blood in stool or vomit. She also reports he has been complaining of sore throat and left ear pain for 6 weeks. Patient with intermittent nasal congestion. No known history of asthma, seasonal allergies. Per chart, last ER visit was beginning of March for similar complaints. Mother reports patient improved after that visit, and has not had diarrhea since.       Past Medical History:  Diagnosis Date  . Down syndrome     Patient Active Problem List   Diagnosis Date Noted  . Gastroenteritis due to norovirus 04/30/2015  . Intractable vomiting 04/29/2015  . Diarrhea 04/29/2015  . Down syndrome 04/29/2015  . Vomiting 03/20/2015  . 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 Cystitis 03/20/2015  . UTI (lower urinary tract infection) 03/20/2015  . Sepsis, unspecified organism (HCC) 03/20/2015  . Cough     Past Surgical History:  Procedure Laterality Date  . MYRINGOTOMY    . NO PAST SURGERIES         Home Medications    Prior to Admission medications   Medication Sig Start Date End Date Taking? Authorizing Provider  COD LIVER OIL PO Take 5 mLs by mouth daily.   Yes [provider]  FERROUS SULFATE PO Take 5 mLs by mouth daily. Liquid Iron, unsure of strength   Yes [provider]  omeprazole (PRILOSEC) 20 MG  capsule Take 20 mg by mouth daily. 06/01/16  Yes [provider]  sucralfate (CARAFATE) 1 GM/10ML suspension Take 10 mLs (1 g total) by mouth 2 (two) times daily. 07/31/16   Russo, [place] N, PA-C    Family History Family History  Problem Relation Age of Onset  . Hypertension Mother   . Cancer Mother     Social History Social History  Substance Use Topics  . Smoking status: Never Smoker  . Smokeless tobacco: Never Used  . Alcohol use No     Allergies   Patient has no known allergies.   Review of Systems Review of Systems  Constitutional: Positive for fatigue. Negative for appetite change and fever.  HENT: Positive for congestion and sore throat.   Eyes: Positive for pain.  Respiratory: Positive for cough. Negative for shortness of breath and wheezing.   Cardiovascular: Negative for chest pain.  Gastrointestinal: Positive for vomiting. Negative for constipation and diarrhea.  Genitourinary: Negative for decreased urine volume.  Allergic/Immunologic: Negative for environmental allergies.  Neurological: Negative for syncope.  Psychiatric/Behavioral: Negative for confusion.     Physical Exam Updated Vital Signs BP 128/86   Pulse 87   Temp 98.3 F (36.8 C) (Oral)   Resp 16   SpO2 99%   Physical Exam  Constitutional: He appears well-developed and well-nourished. No distress.  HENT:  Head: Normocephalic and atraumatic.  Right Ear: Tympanic membrane, external ear and  ear canal normal. No drainage. No mastoid tenderness.  Left Ear: Tympanic membrane, external ear and ear canal normal. No drainage. No mastoid tenderness.  Nose: Nose normal.  Mouth/Throat: Uvula is midline, oropharynx is clear and moist and mucous membranes are normal. No trismus in the jaw. No uvula swelling. No posterior oropharyngeal edema or posterior oropharyngeal erythema.  Eyes: Conjunctivae and EOM are normal. Pupils are equal, round, and reactive to light.  Neck: Normal range of motion. No  tracheal deviation present.  Cardiovascular: Regular rhythm, normal heart sounds and intact distal pulses.  Exam reveals no friction rub.   No murmur heard. Slightly tachycardic  Pulmonary/Chest: Effort normal and breath sounds normal. No stridor. No respiratory distress. He has no wheezes. He has no rales. He exhibits no tenderness.  Abdominal: Soft. Bowel sounds are normal. He exhibits no distension. There is no tenderness. There is no rebound and no guarding.  Musculoskeletal: Normal range of motion.  Lymphadenopathy:    He has no cervical adenopathy.  Neurological: He is alert.  Skin: Skin is warm.  Psychiatric: He has a normal mood and affect. His behavior is normal.  Nursing note and vitals reviewed.    ED Treatments / Results  Labs (all labs ordered are listed, but only abnormal results are displayed) Labs Reviewed  CBC - Abnormal; Notable for the following:       Result Value   WBC 12.7 (*)    All other components within normal limits  LIPASE, BLOOD  COMPREHENSIVE METABOLIC PANEL  URINALYSIS, ROUTINE W REFLEX MICROSCOPIC    EKG  EKG Interpretation None       Radiology No results found.  Procedures Procedures (including critical care time)  Medications Ordered in ED Medications  ondansetron (ZOFRAN-ODT) disintegrating tablet 4 mg (4 mg Oral Given 07/31/16 1042)  gi cocktail (Maalox,Lidocaine,Donnatal) (30 mLs Oral Given 07/31/16 1359)     Initial Impression / Assessment and Plan / ED Course  I have reviewed the triage vital signs and the nursing notes.  Pertinent labs & imaging results that were available during my care of the patient were reviewed by me and considered in my medical decision making (see chart for details).     Pt w dry cough x1 week and intermittent post-tussive emesis. Labs unremarkable, afebrile, not in distress. Lungs CTAB, abd nontender. GI cocktail given in ED w improvement in cough per parents. Suspect cough is 2/t GERD vs bronchitis.  Pt w normal chest and abdominal imaging in March for similar presentation. Will send with carafate to add to PTA prilosec and GI referral to follow up on ongoing symptoms. GERD diet information given. Pt safe for discharge.  Patient discussed with  Dr. Fredderick PhenixBelfi, who agrees with care plan. Discussed results, findings, treatment and follow up. Patient's parent advised of return precautions. Patient's parent verbalized understanding and agreed with plan.    Final Clinical Impressions(s) / ED Diagnoses   Final diagnoses:  Cough  Gastroesophageal reflux disease, esophagitis presence not specified    New Prescriptions New Prescriptions   SUCRALFATE (CARAFATE) 1 GM/10ML SUSPENSION    Take 10 mLs (1 g total) by mouth 2 (two) times daily.     Russo, [place] N, PA-C 07/31/16 1432    Rolan BuccoBelfi, Melanie, MD 07/31/16 1537

## 2016-07-31 NOTE — ED Triage Notes (Signed)
Patient had a cough with n/v since last Wed.  Patient also c/o about sore throat and left ear pain x 6 weeks.

## 2016-07-31 NOTE — Discharge Instructions (Signed)
Please read instructions below.  He can have Carafate twice a day to help his acid reflux. Continue giving him omeprazole daily. Appointment with the gastroenterologist follow-up on his ongoing symptoms. Return to the ER for shortness of breath, fever, or new or concerning symptoms.

## 2016-11-02 ENCOUNTER — Ambulatory Visit (HOSPITAL_COMMUNITY)
Admission: EM | Admit: 2016-11-02 | Discharge: 2016-11-02 | Disposition: A | Discharge status: Home / Self Care | Payer: Medicare Other | Attending: Radiology | Admitting: Radiology

## 2016-11-02 ENCOUNTER — Encounter (HOSPITAL_COMMUNITY): Payer: Self-pay | Admitting: *Deleted

## 2016-11-02 DIAGNOSIS — L0291 Cutaneous abscess, unspecified: Secondary | ICD-10-CM | POA: Diagnosis not present

## 2016-11-02 MED ORDER — CEPHALEXIN 500 MG PO CAPS
500.0000 mg | ORAL_CAPSULE | Freq: Four times a day (QID) | ORAL | 0 refills | Status: DC
Start: 2016-11-02 — End: 2016-11-02

## 2016-11-02 MED ORDER — CHLORHEXIDINE GLUCONATE 4 % EX LIQD: Freq: Every day | CUTANEOUS | 0 refills | Status: DC | PRN

## 2016-11-02 MED ORDER — CEPHALEXIN 250 MG/5ML PO SUSR: 500.0000 mg | Freq: Four times a day (QID) | ORAL | 0 refills | Status: AC

## 2016-11-02 NOTE — ED Triage Notes (Signed)
Pt  Has   A  Rash  On    Scrotal  Area    X  3   Weeks    Pt  Reports       He  Has  Been taking  otc  meds   Without releif     He   Has  Had   Similar  Symptoms  In  Past        Pt   Is  In no  Acute  Distress   At this  Time      Family member   With patient

## 2016-11-02 NOTE — Discharge Instructions (Signed)
Apply warm wash cloth to affected area. Change to an antibacterial soap and follow up with PCP for referral to Dermatology.

## 2016-11-02 NOTE — ED Notes (Signed)
Caregivers   Reports     Pt has  Been  Itching   And  Scratching   -

## 2016-11-02 NOTE — ED Provider Notes (Signed)
[facility]    CSN: [id number] Arrival date & time: 11/02/16  1002     History   Chief Complaint Chief Complaint  Patient presents with  . Rash    HPI Levi Powell is a 31 y.o. male.   31 y.o. 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 Condition is made better by nothing. Condition is made worse by nothing. Patient denies any relief from cortisone prior to there arrival at this facility. Parents at beside states that patient has had similar issues previously. 4 abscesses's are noted on the posterior aspect of the left scrotum. Non fluctuant. Abscess range from 1 to 1.5 com in diameterParents states that the patient is annoyed by them and touches them. Parents deny fevers or other contacts with similar signs and symptoms       Past Medical History:  Diagnosis Date  . Down syndrome     Patient Active Problem List   Diagnosis Date Noted  . Gastroenteritis due to norovirus 04/30/2015  . Intractable vomiting 04/29/2015  . Diarrhea 04/29/2015  . Down syndrome 04/29/2015  . Vomiting 03/20/2015  . 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 Cystitis 03/20/2015  . UTI (lower urinary tract infection) 03/20/2015  . Sepsis, unspecified organism (HCC) 03/20/2015  . Cough     Past Surgical History:  Procedure Laterality Date  . MYRINGOTOMY    . NO PAST SURGERIES         Home Medications    Prior to Admission medications   Medication Sig Start Date End Date Taking? Authorizing Provider  COD LIVER OIL PO Take 5 mLs by mouth daily.    [provider]  FERROUS SULFATE PO Take 5 mLs by mouth daily. Liquid Iron, unsure of strength    [provider]  omeprazole (PRILOSEC) 20 MG capsule Take 20 mg by mouth daily. 06/01/16   [provider]  sucralfate (CARAFATE) 1 GM/10ML suspension Take 10 mLs (1 g total) by mouth 2 (two) times daily. 07/31/16   Russo, [place] N, PA-C    Family History Family History    Problem Relation Age of Onset  . Hypertension Mother   . Cancer Mother     Social History Social History  Substance Use Topics  . Smoking status: Never Smoker  . Smokeless tobacco: Never Used  . Alcohol use No     Allergies   Patient has no known allergies.   Review of Systems Review of Systems  Constitutional: Negative for chills and fever.  HENT: Negative for ear pain and sore throat.   Eyes: Negative for pain and visual disturbance.  Respiratory: Negative for cough and shortness of breath.   Cardiovascular: Negative for chest pain and palpitations.  Gastrointestinal: Negative for abdominal pain and vomiting.  Genitourinary: Negative for dysuria and hematuria.  Musculoskeletal: Negative for arthralgias and back pain.  Skin: Negative for color change and rash.       Abscess to scrotum  Neurological: Negative for seizures and syncope.  All other systems reviewed and are negative.    Physical Exam Triage Vital Signs ED Triage Vitals [11/02/16 1014]  Enc Vitals Group     BP 128/82     Pulse Rate 78     Resp 18     Temp 98.6 F (37 C)     Temp Source Oral     SpO2 100 %     Weight      Height  Head Circumference      Peak Flow      Pain Score      Pain Loc      Pain Edu?      Excl. in GC?    No data found.   Updated Vital Signs BP 128/82 (BP Location: Right Arm)   Pulse 78   Temp 98.6 F (37 C) (Oral)   Resp 18   SpO2 100%   Visual Acuity Right Eye Distance:   Left Eye Distance:   Bilateral Distance:    Right Eye Near:   Left Eye Near:    Bilateral Near:     Physical Exam  Constitutional: He appears well-developed and well-nourished.  HENT:  Head: Normocephalic and atraumatic.  Eyes: Conjunctivae are normal.  Neck: Neck supple.  Cardiovascular: Normal rate and regular rhythm.   No murmur heard. Pulmonary/Chest: Effort normal and breath sounds normal. No respiratory distress.  Abdominal: Soft. There is no tenderness.   Musculoskeletal: He exhibits no edema.  Neurological: He is alert.  Skin: Skin is warm and dry.  4 abscess's to posterior aspect of left scrotum. Non fluctuant rangng from 1 to 1.5 cm  Psychiatric: He has a normal mood and affect.  Nursing note and vitals reviewed.    UC Treatments / Results  Labs (all labs ordered are listed, but only abnormal results are displayed) Labs Reviewed - No data to display  EKG  EKG Interpretation None       Radiology No results found.  Procedures Procedures (including critical care time)  Medications Ordered in UC Medications - No data to display   Initial Impression / Assessment and Plan / UC Course  I have reviewed the triage vital signs and the nursing notes.  Pertinent labs & imaging results that were available during my care of the patient were reviewed by me and considered in my medical decision making (see chart for details).       Final Clinical Impressions(s) / UC Diagnoses   Final diagnoses:  None    New Prescriptions New Prescriptions   No medications on file     Controlled Substance Prescriptions [place] Controlled Substance Registry consulted? Not Applicable   Alene MiresOmohundro, Monti Villers C, NP 11/02/16 1034

## 2016-11-04 NOTE — ED Notes (Signed)
Dr  hagler   Sherron MondaySpoke  With  [facility]   About dosing  The  Keflex    pts  Father  Notified  Of  Plan of  Care  And   thanked  Con-wayhe  Writer

## 2016-11-30 ENCOUNTER — Encounter (HOSPITAL_COMMUNITY): Payer: Self-pay

## 2016-11-30 ENCOUNTER — Emergency Department (HOSPITAL_COMMUNITY): Payer: Medicare Other

## 2016-11-30 ENCOUNTER — Emergency Department (HOSPITAL_COMMUNITY)
Admission: EM | Admit: 2016-11-30 | Discharge: 2016-11-30 | Disposition: A | Discharge status: Home / Self Care | Payer: Medicare Other | Attending: Emergency Medicine | Admitting: Emergency Medicine

## 2016-11-30 DIAGNOSIS — R112 Nausea with vomiting, unspecified: Secondary | ICD-10-CM | POA: Insufficient documentation

## 2016-11-30 DIAGNOSIS — R197 Diarrhea, unspecified: Secondary | ICD-10-CM | POA: Diagnosis not present

## 2016-11-30 DIAGNOSIS — K529 Noninfective gastroenteritis and colitis, unspecified: Secondary | ICD-10-CM | POA: Diagnosis not present

## 2016-11-30 DIAGNOSIS — Z79899 Other long term (current) drug therapy: Secondary | ICD-10-CM | POA: Insufficient documentation

## 2016-11-30 DIAGNOSIS — R1013 Epigastric pain: Secondary | ICD-10-CM | POA: Diagnosis present

## 2016-11-30 LAB — URINALYSIS, ROUTINE W REFLEX MICROSCOPIC
Bacteria, UA: NONE SEEN
Bilirubin Urine: NEGATIVE
GLUCOSE, UA: NEGATIVE mg/dL
Hgb urine dipstick: NEGATIVE
Ketones, ur: NEGATIVE mg/dL
Leukocytes, UA: NEGATIVE
Nitrite: NEGATIVE
PROTEIN: 30 mg/dL — AB
Specific Gravity, Urine: 1.027 (ref 1.005–1.030)
pH: 8 (ref 5.0–8.0)

## 2016-11-30 LAB — COMPREHENSIVE METABOLIC PANEL
ALBUMIN: 4.5 g/dL (ref 3.5–5.0)
ALT: 24 U/L (ref 17–63)
AST: 31 U/L (ref 15–41)
Alkaline Phosphatase: 72 U/L (ref 38–126)
Anion gap: 11 (ref 5–15)
BUN: 18 mg/dL (ref 6–20)
CHLORIDE: 104 mmol/L (ref 101–111)
CO2: 25 mmol/L (ref 22–32)
CREATININE: 1.25 mg/dL — AB (ref 0.61–1.24)
Calcium: 9.2 mg/dL (ref 8.9–10.3)
GFR calc non Af Amer: >60 mL/min (ref >60)
GLUCOSE: 129 mg/dL — AB (ref 65–99)
Potassium: 4.2 mmol/L (ref 3.5–5.1)
Sodium: 140 mmol/L (ref 135–145)
Total Bilirubin: 0.9 mg/dL (ref 0.3–1.2)
Total Protein: 7.8 g/dL (ref 6.5–8.1)

## 2016-11-30 LAB — CBC
HCT: 39.8 % (ref 39.0–52.0)
Hemoglobin: 14 g/dL (ref 13.0–17.0)
MCH: 31.9 pg (ref 26.0–34.0)
MCHC: 35.2 g/dL (ref 30.0–36.0)
MCV: 90.7 fL (ref 78.0–100.0)
PLATELETS: 268 10*3/uL (ref 150–400)
RBC: 4.39 MIL/uL (ref 4.22–5.81)
RDW: 14.3 % (ref 11.5–15.5)
WBC: 14.2 10*3/uL — ABNORMAL HIGH (ref 4.0–10.5)

## 2016-11-30 LAB — LIPASE, BLOOD: LIPASE: 25 U/L (ref 11–51)

## 2016-11-30 MED ORDER — SODIUM CHLORIDE 0.9 % IV BOLUS (SEPSIS)
1000.0000 mL | Freq: Once | INTRAVENOUS | Status: AC
  Administered 2016-11-30: 1000 mL via INTRAVENOUS

## 2016-11-30 MED ORDER — MORPHINE SULFATE (PF) 4 MG/ML IV SOLN
4.0000 mg | Freq: Once | INTRAVENOUS | Status: AC
  Administered 2016-11-30: 4 mg via INTRAVENOUS
  Filled 2016-11-30: qty 1

## 2016-11-30 MED ORDER — METRONIDAZOLE 50 MG/ML ORAL SUSPENSION: 500.0000 mg | Freq: Three times a day (TID) | ORAL | 0 refills | Status: AC

## 2016-11-30 MED ORDER — METOCLOPRAMIDE HCL 5 MG/ML IJ SOLN
10.0000 mg | Freq: Once | INTRAMUSCULAR | Status: AC
  Administered 2016-11-30: 10 mg via INTRAVENOUS
  Filled 2016-11-30: qty 2

## 2016-11-30 MED ORDER — IOPAMIDOL (ISOVUE-300) INJECTION 61%
INTRAVENOUS | Status: AC
  Administered 2016-11-30: 100 mL
  Filled 2016-11-30: qty 100

## 2016-11-30 MED ORDER — CIPROFLOXACIN 500 MG/5ML (10%) PO SUSR: 500.0000 mg | Freq: Two times a day (BID) | ORAL | 0 refills | Status: AC

## 2016-11-30 MED ORDER — ONDANSETRON 4 MG PO TBDP: 4.0000 mg | ORAL_TABLET | Freq: Three times a day (TID) | ORAL | 0 refills | Status: DC | PRN

## 2016-11-30 NOTE — ED Provider Notes (Signed)
Assumed care of patient at shift change.  Briefly patient is a 31 year old male with a history of Down syndrome who presents today for evaluation of nausea, vomiting, and diarrhea. He is accompanied by his parents.  Please see note from Rise Mu PA-C for full H and P  Plan: follow up on CT, treat as needed, most likely discharge home if can pass PO trial.   CT result viewed by me.  Appears consistent with colitis and possible UTI.  Urine does not support UTI but will treat colitis with Cipro/flagyl which should also cover any UTI.  I evaluated the patient who was resting comfortably in bed, no obvious distress.  Denied pain or nausea.  Says is ready to go home.  Plan was discussed with parents who are his care givers.     At this time there does not appear to be any evidence of an acute emergency medical condition and the patient appears stable for discharge with appropriate outpatient follow up.Diagnosis was discussed with patient who verbalizes understanding and is agreeable to discharge.    CT:  CLINICAL DATA: Initial evaluation for acute abdominal pain.    EXAM:  CT ABDOMEN AND PELVIS WITH CONTRAST    TECHNIQUE:  Multidetector CT imaging of the abdomen and pelvis was performed  using the standard protocol following bolus administration of  intravenous contrast.    CONTRAST: ISOVUE-300 IOPAMIDOL (ISOVUE-300) INJECTION 61%    COMPARISON: Prior CT from 05/03/2016.    FINDINGS:  Lower chest: Trace layering bilateral pleural effusions with  associated atelectasis. Visualized lungs otherwise clear.    Hepatobiliary: 10 mm probable hemangioma at the subcapsular left  hepatic lobe noted, stable from previous. Focal fat deposition noted  adjacent to the falciform ligament. Subcentimeter hypodensity within  the inferior right hepatic lobe, too small the characterize. Liver  otherwise unremarkable. Gallbladder within normal limits. No biliary  dilatation.     Pancreas: Pancreas within normal limits.    Spleen: Spleen within normal limits.    Adrenals/Urinary Tract: Adrenal glands are normal. Kidneys equal in  size with symmetric enhancement. No nephrolithiasis, hydronephrosis,  or focal enhancing renal mass. No hydroureter. Mild circumferential  bladder wall thickening, which may be related to incomplete  distension. Possible acute cystitis could also have this appearance.    Stomach/Bowel: Stomach within normal limits. No evidence for bowel  obstruction. There is mild circumferential wall thickening about the  transverse and descending colon with subtle hazy perienteric  inflammatory stranding. Findings suggest possible acute colitis. No  other acute inflammatory changes seen about the bowels. No evidence  for acute appendicitis.    Vascular/Lymphatic: Normal intravascular enhancement seen throughout  the abdomen and pelvis. No adenopathy.    Reproductive: Prostate normal.    Other: No free air or fluid. Fat containing ventral hernia without  associated inflammation noted.    Musculoskeletal: No acute osseous abnormality. No worrisome lytic or  blastic osseous lesions.    IMPRESSION:  1. Diffuse circumferential wall thickening about the transverse and  descending colon. While this finding may in part be related  incomplete distension, possible acute colitis could also have this  appearance in the correct clinical setting.  2. Circumferential wall thickening about the urinary bladder. Again,  while this may be related to incomplete distension, acute cystitis  could also be considered. Correlation with urinalysis recommended.  3. No other acute intra-abdominal or pelvic process.      Electronically Signed  By: Rise Mu M.D.  On: 11/30/2016  17:[address] 11/30/16 2023    Jacalyn Lefevre, MD 11/30/16 2123

## 2016-11-30 NOTE — ED Provider Notes (Signed)
[facility]-EMERGENCY DEPT Provider Note   CSN: [id number] Arrival date & time: 11/30/16  4098     History   Chief Complaint Chief Complaint  Patient presents with  . Emesis  . Diarrhea  . Abdominal Pain    HPI KYSHON TOLLIVER is a 31 y.o. male.  HPI 31 year old Hispanic male past medical history significant for Down syndrome comes to the ED today with parents with complaints of acute onset nausea, vomiting, diarrhea started 2 days ago. Mother states that he gets like this about twice a year. Patient has been having several episodes of nonbloody nonbilious emesis along with nonbloody loose stools since 2 days ago. Mother states patient also complains of epigastric abdominal pain. Mother states that diarrhea is foul smelling. No worsening factors. Nothing makes better or worse. Has not tried anything for his symptoms prior to arrival. Abdominal pain is cramping in nature. Denies any associated fever, urinary symptoms. Patient with history of acid reflux and on Prilosec.  Pt denies any fever, chill, ha, vision changes, lightheadedness, dizziness, congestion, neck pain, cp, sob, cough,urinary symptoms, melena, hematochezia, lower extremity paresthesias.  Past Medical History:  Diagnosis Date  . Down syndrome     Patient Active Problem List   Diagnosis Date Noted  . Gastroenteritis due to norovirus 04/30/2015  . Intractable vomiting 04/29/2015  . Diarrhea 04/29/2015  . Down syndrome 04/29/2015  . Vomiting 03/20/2015  . 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 Cystitis 03/20/2015  . UTI (lower urinary tract infection) 03/20/2015  . Sepsis, unspecified organism (HCC) 03/20/2015  . Cough     Past Surgical History:  Procedure Laterality Date  . MYRINGOTOMY    . NO PAST SURGERIES         Home Medications    Prior to Admission medications   Medication Sig Start Date End Date Taking? Authorizing Provider  acetaminophen (TYLENOL) 500 MG tablet Take 500 mg by mouth every 6 (six) hours as  needed for moderate pain.   Yes [provider]  FERROUS SULFATE PO Take 5 mLs by mouth daily. Liquid Iron, unsure of strength   Yes [provider]  omeprazole (PRILOSEC) 20 MG capsule Take 20 mg by mouth daily. 06/01/16  Yes [provider]  chlorhexidine (HIBICLENS) 4 % external liquid Apply topically daily as needed. Patient not taking: Reported on 11/30/2016 11/02/16   Alene Mires, NP  sucralfate (CARAFATE) 1 GM/10ML suspension Take 10 mLs (1 g total) by mouth 2 (two) times daily. Patient not taking: Reported on 11/30/2016 07/31/16   Russo, [place] N, PA-C    Family History Family History  Problem Relation Age of Onset  . Hypertension Mother   . Cancer Mother     Social History Social History  Substance Use Topics  . Smoking status: Never Smoker  . Smokeless tobacco: Never Used  . Alcohol use No     Allergies   Patient has no known allergies.   Review of Systems Review of Systems  Constitutional: Negative for chills and fever.  HENT: Negative for congestion and sore throat.   Eyes: Negative for visual disturbance.  Respiratory: Negative for cough and shortness of breath.   Cardiovascular: Negative for chest pain.  Gastrointestinal: Positive for abdominal pain, diarrhea, nausea and vomiting. Negative for abdominal distention and constipation.  Genitourinary: Negative for dysuria, flank pain, frequency, hematuria, scrotal swelling, testicular pain and urgency.  Musculoskeletal: Negative for arthralgias and myalgias.  Skin: Negative for rash.  Neurological: Negative for dizziness, syncope, weakness, light-headedness, numbness and  headaches.  Psychiatric/Behavioral: Negative for sleep disturbance. The patient is not nervous/anxious.      Physical Exam Updated Vital Signs BP (!) 100/55   Pulse 85   Temp 97.8 F (36.6 C) (Oral)   Resp 18   Wt 78 kg (172 lb)   SpO2 (!) 89%   BMI 32.50 kg/m   Physical Exam  Constitutional: He is  oriented to person, place, and time. He appears well-developed and well-nourished.  Non-toxic appearance. No distress.  HENT:  Head: Normocephalic and atraumatic.  Mouth/Throat: Oropharynx is clear and moist.  Mucous membranes appear moist.  Eyes: Pupils are equal, round, and reactive to light. Conjunctivae are normal. Right eye exhibits no discharge. Left eye exhibits no discharge.  Neck: Normal range of motion. Neck supple.  Cardiovascular: Normal rate, regular rhythm, normal heart sounds and intact distal pulses.  Exam reveals no gallop and no friction rub.   No murmur heard. Pulmonary/Chest: Effort normal and breath sounds normal. No respiratory distress. He has no wheezes. He has no rales. He exhibits no tenderness.  Abdominal: Soft. He exhibits no distension. Bowel sounds are increased. There is tenderness in the right lower quadrant and epigastric area. There is no rigidity, no rebound, no guarding, no CVA tenderness, no tenderness at McBurney's point and negative Murphy's sign.  Musculoskeletal: Normal range of motion. He exhibits no tenderness.  Lymphadenopathy:    He has no cervical adenopathy.  Neurological: He is alert and oriented to person, place, and time.  Skin: Skin is warm and dry. Capillary refill takes less than 2 seconds. No rash noted.  Psychiatric: His behavior is normal. Judgment and thought content normal.  Nursing note and vitals reviewed.    ED Treatments / Results  Labs (all labs ordered are listed, but only abnormal results are displayed) Labs Reviewed  COMPREHENSIVE METABOLIC PANEL - Abnormal; Notable for the following:       Result Value   Glucose, Bld 129 (*)    Creatinine, Ser 1.25 (*)    All other components within normal limits  CBC - Abnormal; Notable for the following:    WBC 14.2 (*)    All other components within normal limits  URINALYSIS, ROUTINE W REFLEX MICROSCOPIC - Abnormal; Notable for the following:    Protein, ur 30 (*)    Squamous  Epithelial / LPF 0-5 (*)    All other components within normal limits  LIPASE, BLOOD    EKG  EKG Interpretation None       Radiology No results found.  Procedures Procedures (including critical care time)  Medications Ordered in ED Medications  sodium chloride 0.9 % bolus 1,000 mL (0 mLs Intravenous Stopped 11/30/16 1600)  morphine 4 MG/ML injection 4 mg (4 mg Intravenous Given 11/30/16 1501)  metoCLOPramide (REGLAN) injection 10 mg (10 mg Intravenous Given 11/30/16 1501)  sodium chloride 0.9 % bolus 1,000 mL (1,000 mLs Intravenous New Bag/Given 11/30/16 1606)     Initial Impression / Assessment and Plan / ED Course  I have reviewed the triage vital signs and the nursing notes.  Pertinent labs & imaging results that were available during my care of the patient were reviewed by me and considered in my medical decision making (see chart for details).     Patient presents to the ED with complaints of abdominal pain, nausea, emesis, diarrhea. Onset 2 days ago. History of same. Mother states that loose stools are foul-smelling. Denies any associated fevers or bloody diarrhea. No recent travel.  Vital signs  reassuring on exam. Patient is afebrile, no tachycardia, no hypotension noted.  Patient does have some focal tenderness on exam and epigastric and right lower quadrant regions of the abdomen. No signs of peritonitis. No rebound. Lungs clear to auscultation bilaterally.   Laboratory reveals leukocytosis of 14,000. Mild elevation in creatinine to 1.25 likely due to dehydration. Patient given 2 fluid boluses. Electrolytes are baseline. UA shows no signs of infection.  Pain and nausea has been controlled in the ED at this time. After the pain medicine patient's pressure did drop slightly however has stabilized after fluid bolus.  Given patient's focal right lower quadrant and epigastric tenderness on exam with leukocytosis will CT abdomen pelvis to look for any colitis that would  require products vs other acute intra-abdominal pathology.  Parents updated on plan of care. Pt remains hemodynamically satble at this time.   Care handoff to PA Hammon. Pt has pending at this time  Ct scan repeat exam and po challenge.  Disposition likely homepending lab and test results. Care dicussed and plan agreed upon with oncoming PA. Pt updated on plan of care and is currently hemodynamically stable at this time with normal vs.      Final Clinical Impressions(s) / ED Diagnoses   Final diagnoses:  None    New Prescriptions New Prescriptions   No medications on file     Wallace Keller 11/30/16 1619    Shaune Pollack, MD 12/01/16 1821

## 2016-11-30 NOTE — ED Triage Notes (Signed)
Patient's mother reports mid abdominal pain, vomiting, and very foul smelling diarrhea x 2 days.

## 2016-11-30 NOTE — Discharge Instructions (Signed)
Please follow up with his primary care doctor.   You may have diarrhea from the antibiotics.  It is very important that you continue to take the antibiotics even if you get diarrhea unless a medical professional tells you that you may stop taking them.  If you stop too early the bacteria you are being treated for will become stronger and you may need different, more powerful antibiotics that have more side effects and worsening diarrhea.  Please stay well hydrated and consider probiotics as they may decrease the severity of your diarrhea.    PLEASE DO NOT DRINK ANY ALCOHOL while taking flagyl.  It will cause you to have a violent reaction.

## 2017-04-01 ENCOUNTER — Emergency Department (HOSPITAL_COMMUNITY)
Admission: EM | Admit: 2017-04-01 | Discharge: 2017-04-01 | Disposition: A | Discharge status: Home / Self Care | Payer: Medicare Other | Attending: Emergency Medicine | Admitting: Emergency Medicine

## 2017-04-01 ENCOUNTER — Encounter (HOSPITAL_COMMUNITY): Payer: Self-pay

## 2017-04-01 ENCOUNTER — Other Ambulatory Visit: Payer: Self-pay

## 2017-04-01 DIAGNOSIS — R112 Nausea with vomiting, unspecified: Secondary | ICD-10-CM | POA: Diagnosis present

## 2017-04-01 DIAGNOSIS — Q902 Trisomy 21, translocation: Secondary | ICD-10-CM | POA: Insufficient documentation

## 2017-04-01 DIAGNOSIS — Z79899 Other long term (current) drug therapy: Secondary | ICD-10-CM | POA: Diagnosis not present

## 2017-04-01 DIAGNOSIS — R1114 Bilious vomiting: Secondary | ICD-10-CM | POA: Diagnosis not present

## 2017-04-01 LAB — URINALYSIS, ROUTINE W REFLEX MICROSCOPIC
Bilirubin Urine: NEGATIVE
GLUCOSE, UA: NEGATIVE mg/dL
HGB URINE DIPSTICK: NEGATIVE
Ketones, ur: NEGATIVE mg/dL
Leukocytes, UA: NEGATIVE
Nitrite: NEGATIVE
PH: 7 (ref 5.0–8.0)
Protein, ur: NEGATIVE mg/dL
SPECIFIC GRAVITY, URINE: 1.027 (ref 1.005–1.030)

## 2017-04-01 LAB — CBC
HEMATOCRIT: 42 % (ref 39.0–52.0)
HEMOGLOBIN: 14.7 g/dL (ref 13.0–17.0)
MCH: 31.9 pg (ref 26.0–34.0)
MCHC: 35 g/dL (ref 30.0–36.0)
MCV: 91.1 fL (ref 78.0–100.0)
Platelets: 264 10*3/uL (ref 150–400)
RBC: 4.61 MIL/uL (ref 4.22–5.81)
RDW: 14 % (ref 11.5–15.5)
WBC: 13.7 10*3/uL — ABNORMAL HIGH (ref 4.0–10.5)

## 2017-04-01 LAB — COMPREHENSIVE METABOLIC PANEL
ALBUMIN: 4.2 g/dL (ref 3.5–5.0)
ALK PHOS: 76 U/L (ref 38–126)
ALT: 28 U/L (ref 17–63)
ANION GAP: 7 (ref 5–15)
AST: 28 U/L (ref 15–41)
BUN: 19 mg/dL (ref 6–20)
CALCIUM: 9.2 mg/dL (ref 8.9–10.3)
CHLORIDE: 106 mmol/L (ref 101–111)
CO2: 25 mmol/L (ref 22–32)
Creatinine, Ser: 1.12 mg/dL (ref 0.61–1.24)
GFR calc Af Amer: >60 mL/min (ref >60)
GFR calc non Af Amer: >60 mL/min (ref >60)
GLUCOSE: 141 mg/dL — AB (ref 65–99)
Potassium: 3.9 mmol/L (ref 3.5–5.1)
SODIUM: 138 mmol/L (ref 135–145)
Total Bilirubin: 0.7 mg/dL (ref 0.3–1.2)
Total Protein: 7.5 g/dL (ref 6.5–8.1)

## 2017-04-01 LAB — LIPASE, BLOOD: Lipase: 31 U/L (ref 11–51)

## 2017-04-01 MED ORDER — SODIUM CHLORIDE 0.9 % IV BOLUS (SEPSIS)
1000.0000 mL | Freq: Once | INTRAVENOUS | Status: AC
  Administered 2017-04-01: 1000 mL via INTRAVENOUS

## 2017-04-01 MED ORDER — PROCHLORPERAZINE EDISYLATE 5 MG/ML IJ SOLN
10.0000 mg | Freq: Once | INTRAMUSCULAR | Status: AC
Start: 2017-04-01 — End: 2017-04-01
  Administered 2017-04-01: 10 mg via INTRAVENOUS
  Filled 2017-04-01: qty 2

## 2017-04-01 MED ORDER — ONDANSETRON 4 MG PO TBDP: 4.0000 mg | ORAL_TABLET | Freq: Three times a day (TID) | ORAL | 0 refills | Status: DC | PRN

## 2017-04-01 NOTE — Discharge Instructions (Signed)
As discussed, your evaluation today has been largely reassuring.  But, it is important that you monitor your condition carefully, and do not hesitate to return to the ED if you develop new, or concerning changes in your condition. ? ?Otherwise, please follow-up with your physician for appropriate ongoing care. ? ?

## 2017-04-01 NOTE — ED Provider Notes (Signed)
[facility]   CSN: [id number] Arrival date & time: 04/01/17  1110     History   Chief Complaint Chief Complaint  Patient presents with  . Emesis  . Diarrhea    HPI Levi Powell is a 32 y.o. male.  HPI Male with history of Down syndrome presents with his father who assists with the HPI. Father notes that since awakening this morning, a few hours ago the patient has had intractable vomiting, nausea, but no abdominal pain. The patient herself nods corroborate the HPI, but is minimally verbal. Family notes the patient was in his usual state of health until going to sleep last night That he did go out for dinner, ate pizza. Patient has no history of abdominal surgery, and was generally well prior to this morning. Since onset this morning, no ability to tolerate oral intake, no medication taken for relief, with no relief with anything. Past Medical History:  Diagnosis Date  . Down syndrome     Patient Active Problem List   Diagnosis Date Noted  . Gastroenteritis due to norovirus 04/30/2015  . Intractable vomiting 04/29/2015  . Diarrhea 04/29/2015  . Down syndrome 04/29/2015  . Vomiting 03/20/2015  . 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 Cystitis 03/20/2015  . UTI (lower urinary tract infection) 03/20/2015  . Sepsis, unspecified organism (HCC) 03/20/2015  . Cough     Past Surgical History:  Procedure Laterality Date  . MYRINGOTOMY    . NO PAST SURGERIES         Home Medications    Prior to Admission medications   Medication Sig Start Date End Date Taking? Authorizing Provider  FERROUS SULFATE PO Take 5 mLs by mouth daily. Liquid Iron, unsure of strength   Yes [provider]  omeprazole (PRILOSEC) 20 MG capsule Take 20 mg by mouth daily. 06/01/16  Yes [provider]  chlorhexidine (HIBICLENS) 4 % external liquid Apply topically daily as needed. Patient not taking: Reported on 11/30/2016 11/02/16    Alene Mires, NP  ondansetron (ZOFRAN ODT) 4 MG disintegrating tablet Take 1 tablet (4 mg total) by mouth every 8 (eight) hours as needed for nausea or vomiting. Patient not taking: Reported on 04/01/2017 11/30/16   Cristina Gong, PA-C    Family History Family History  Problem Relation Age of Onset  . Hypertension Mother   . Cancer Mother     Social History Social History   Tobacco Use  . Smoking status: Never Smoker  . Smokeless tobacco: Never Used  Substance Use Topics  . Alcohol use: No  . Drug use: No     Allergies   Patient has no known allergies.   Review of Systems Review of Systems  Constitutional:       Per HPI, otherwise negative  HENT:       Per HPI, otherwise negative  Respiratory:       Per HPI, otherwise negative  Cardiovascular:       Per HPI, otherwise negative  Gastrointestinal: Positive for nausea and vomiting. Negative for abdominal pain and diarrhea.  Endocrine:       Negative aside from HPI  Genitourinary:       Neg aside from HPI   Musculoskeletal:       Per HPI, otherwise negative  Skin: Negative.   Neurological: Negative for syncope.  Psychiatric/Behavioral:       Down syndrome     Physical Exam Updated Vital Signs BP 119/66  Pulse 91   Temp 98.6 F (37 C) (Oral)   Resp 16   Ht 5\' 3"  (1.6 m)   Wt 77.1 kg (170 lb)   SpO2 99%   BMI 30.11 kg/m   Physical Exam  Constitutional: He is oriented to person, place, and time. He appears well-developed. No distress.  Uncomfortable appearing young male sitting upright, leaning over an emesis bag.  HENT:  Head: Normocephalic and atraumatic.  Eyes: Conjunctivae and EOM are normal.  Cardiovascular: Normal rate and regular rhythm.  Pulmonary/Chest: Effort normal. No stridor. No respiratory distress.  Abdominal: He exhibits no distension. There is no tenderness. There is no guarding.  Musculoskeletal: He exhibits no edema.  Neurological: He is alert and oriented to  person, place, and time.  Stigmata of Down syndrome  Skin: Skin is warm and dry.  Psychiatric: He has a normal mood and affect.  Nursing note and vitals reviewed.    ED Treatments / Results  Labs (all labs ordered are listed, but only abnormal results are displayed) Labs Reviewed  COMPREHENSIVE METABOLIC PANEL - Abnormal; Notable for the following components:      Result Value   Glucose, Bld 141 (*)    All other components within normal limits  CBC - Abnormal; Notable for the following components:   WBC 13.7 (*)    All other components within normal limits  LIPASE, BLOOD  URINALYSIS, ROUTINE W REFLEX MICROSCOPIC    Procedures Procedures (including critical care time)  Medications Ordered in ED Medications  sodium chloride 0.9 % bolus 1,000 mL (1,000 mLs Intravenous New Bag/Given 04/01/17 1210)  prochlorperazine (COMPAZINE) injection 10 mg (10 mg Intravenous Given 04/01/17 1211)     Initial Impression / Assessment and Plan / ED Course  I have reviewed the triage vital signs and the nursing notes.  Pertinent labs & imaging results that were available during my care of the patient were reviewed by me and considered in my medical decision making (see chart for details).     1:53 PM Patient much more comfortable, resting in the gurney, no active vomiting, continues to deny any pain, has no fever, remains hemodynamically unremarkable.  This 32 year old male presents with persistent vomiting. Given the father's description of general well-being prior to onset of vomiting, which occurred after eating out last night, suggestion for food reaction. Patient does have mild leukocytosis, but otherwise labs are reassuring, no evidence for hepatobiliary dysfunction. Week with no abdominal pain, low suspicion for acute abdominal processes. Patient discharged in stable condition with ongoing antiemetics, return precautions.  Final Clinical Impressions(s) / ED Diagnoses   Final  diagnoses:  Bilious vomiting with nausea    ED Discharge Orders        Ordered    ondansetron (ZOFRAN ODT) 4 MG disintegrating tablet  Every 8 hours PRN     04/01/17 1355       Gerhard MunchLockwood, Leonides Minder, MD 04/01/17 1552

## 2017-04-01 NOTE — ED Triage Notes (Signed)
Patient c/o diarrhea and vomiting since 0500 today. Patient's father states he has not c/o abdominal pain.

## 2017-04-05 ENCOUNTER — Encounter (HOSPITAL_COMMUNITY): Payer: Self-pay | Admitting: Emergency Medicine

## 2017-04-05 ENCOUNTER — Emergency Department (HOSPITAL_COMMUNITY): Payer: Medicare Other

## 2017-04-05 ENCOUNTER — Emergency Department (HOSPITAL_COMMUNITY)
Admission: EM | Admit: 2017-04-05 | Discharge: 2017-04-05 | Disposition: A | Discharge status: Home / Self Care | Payer: Medicare Other | Attending: Emergency Medicine | Admitting: Emergency Medicine

## 2017-04-05 ENCOUNTER — Other Ambulatory Visit: Payer: Self-pay

## 2017-04-05 DIAGNOSIS — R1111 Vomiting without nausea: Secondary | ICD-10-CM

## 2017-04-05 DIAGNOSIS — Z79899 Other long term (current) drug therapy: Secondary | ICD-10-CM | POA: Insufficient documentation

## 2017-04-05 DIAGNOSIS — J069 Acute upper respiratory infection, unspecified: Secondary | ICD-10-CM | POA: Insufficient documentation

## 2017-04-05 LAB — URINALYSIS, ROUTINE W REFLEX MICROSCOPIC
BILIRUBIN URINE: NEGATIVE
Glucose, UA: NEGATIVE mg/dL
Hgb urine dipstick: NEGATIVE
Ketones, ur: 20 mg/dL — AB
Leukocytes, UA: NEGATIVE
NITRITE: NEGATIVE
PROTEIN: NEGATIVE mg/dL
SPECIFIC GRAVITY, URINE: 1.026 (ref 1.005–1.030)
pH: 6 (ref 5.0–8.0)

## 2017-04-05 LAB — CBC
HCT: 38.6 % — ABNORMAL LOW (ref 39.0–52.0)
HEMOGLOBIN: 13.3 g/dL (ref 13.0–17.0)
MCH: 31.7 pg (ref 26.0–34.0)
MCHC: 34.5 g/dL (ref 30.0–36.0)
MCV: 92.1 fL (ref 78.0–100.0)
Platelets: 216 10*3/uL (ref 150–400)
RBC: 4.19 MIL/uL — AB (ref 4.22–5.81)
RDW: 14.5 % (ref 11.5–15.5)
WBC: 5.5 10*3/uL (ref 4.0–10.5)

## 2017-04-05 LAB — COMPREHENSIVE METABOLIC PANEL
ALT: 21 U/L (ref 17–63)
ANION GAP: 7 (ref 5–15)
AST: 21 U/L (ref 15–41)
Albumin: 3.9 g/dL (ref 3.5–5.0)
Alkaline Phosphatase: 64 U/L (ref 38–126)
BUN: 16 mg/dL (ref 6–20)
CO2: 28 mmol/L (ref 22–32)
Calcium: 8.7 mg/dL — ABNORMAL LOW (ref 8.9–10.3)
Chloride: 105 mmol/L (ref 101–111)
Creatinine, Ser: 1.39 mg/dL — ABNORMAL HIGH (ref 0.61–1.24)
GFR calc non Af Amer: >60 mL/min (ref >60)
Glucose, Bld: 102 mg/dL — ABNORMAL HIGH (ref 65–99)
Potassium: 4 mmol/L (ref 3.5–5.1)
Sodium: 140 mmol/L (ref 135–145)
TOTAL PROTEIN: 7.1 g/dL (ref 6.5–8.1)
Total Bilirubin: 0.6 mg/dL (ref 0.3–1.2)

## 2017-04-05 LAB — LIPASE, BLOOD: LIPASE: 30 U/L (ref 11–51)

## 2017-04-05 MED ORDER — PREDNISONE 20 MG PO TABS: 40.0000 mg | ORAL_TABLET | Freq: Every day | ORAL | 0 refills | Status: DC

## 2017-04-05 MED ORDER — METHYLPREDNISOLONE SODIUM SUCC 125 MG IJ SOLR
125.0000 mg | Freq: Once | INTRAMUSCULAR | Status: AC
  Administered 2017-04-05: 125 mg via INTRAVENOUS
  Filled 2017-04-05: qty 2

## 2017-04-05 MED ORDER — GUAIFENESIN-DM 100-10 MG/5ML PO SYRP: 5.0000 mL | ORAL_SOLUTION | Freq: Three times a day (TID) | ORAL | 0 refills | Status: DC | PRN

## 2017-04-05 MED ORDER — ONDANSETRON HCL 4 MG/2ML IJ SOLN
4.0000 mg | Freq: Once | INTRAMUSCULAR | Status: AC
  Administered 2017-04-05: 4 mg via INTRAVENOUS
  Filled 2017-04-05: qty 2

## 2017-04-05 MED ORDER — SODIUM CHLORIDE 0.9 % IV BOLUS (SEPSIS)
1000.0000 mL | Freq: Once | INTRAVENOUS | Status: AC
  Administered 2017-04-05: 1000 mL via INTRAVENOUS

## 2017-04-05 NOTE — ED Triage Notes (Signed)
Patient BIB mother, reports worsening cough and continued vomiting since being seen on Wednesday. Denies diarrhea. Patient hx down syndrome.

## 2017-04-05 NOTE — ED Notes (Signed)
Did not respond when called for vitals.

## 2017-04-05 NOTE — ED Provider Notes (Signed)
[facility]-EMERGENCY DEPT Provider Note   CSN: [id number] Arrival date & time: 04/05/17  1051     History   Chief Complaint Chief Complaint  Patient presents with  . Cough  . Emesis   Level 5 caveat due to nonverbal status. HPI Levi Powell is a 32 y.o. male.  HPI Patient history of Down syndrome.  Seen in the ER a few days ago for nausea and vomiting with cough.  Has been doing better for 1 day but had return of symptoms.  Decreased appetite.  Continued cough with no real sputum production.  Patient's family member states that he coughs until he throws up.  No diarrhea.  No abdominal pain.  Complaining of some pain in his throat.  Reportedly had some fevers.  No dysuria.  History of GERD. Past Medical History:  Diagnosis Date  . Down syndrome     Patient Active Problem List   Diagnosis Date Noted  . Gastroenteritis due to norovirus 04/30/2015  . Intractable vomiting 04/29/2015  . Diarrhea 04/29/2015  . Down syndrome 04/29/2015  . Vomiting 03/20/2015  . 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 Cystitis 03/20/2015  . UTI (lower urinary tract infection) 03/20/2015  . Sepsis, unspecified organism (HCC) 03/20/2015  . Cough     Past Surgical History:  Procedure Laterality Date  . MYRINGOTOMY    . NO PAST SURGERIES         Home Medications    Prior to Admission medications   Medication Sig Start Date End Date Taking? Authorizing Provider  FERROUS SULFATE PO Take 5 mLs by mouth daily. Liquid Iron, unsure of strength   Yes [provider]  omeprazole (PRILOSEC) 20 MG capsule Take 20 mg by mouth daily. 06/01/16  Yes [provider]  ondansetron (ZOFRAN ODT) 4 MG disintegrating tablet Take 1 tablet (4 mg total) by mouth every 8 (eight) hours as needed for nausea or vomiting. 04/01/17  Yes Gerhard Munch, MD  chlorhexidine (HIBICLENS) 4 % external liquid Apply topically daily as needed. Patient not taking: Reported on 11/30/2016 11/02/16    Alene Mires, NP  guaiFENesin-dextromethorphan (ROBITUSSIN DM) 100-10 MG/5ML syrup Take 5 mLs by mouth 3 (three) times daily as needed for cough. 04/05/17   Benjiman Core, MD  predniSONE (DELTASONE) 20 MG tablet Take 2 tablets (40 mg total) by mouth daily. 04/05/17   Benjiman Core, MD    Family History Family History  Problem Relation Age of Onset  . Hypertension Mother   . Cancer Mother     Social History Social History   Tobacco Use  . Smoking status: Never Smoker  . Smokeless tobacco: Never Used  Substance Use Topics  . Alcohol use: No  . Drug use: No     Allergies   Patient has no known allergies.   Review of Systems Review of Systems  Unable to perform ROS: Patient nonverbal     Physical Exam Updated Vital Signs BP 110/72   Pulse 90   Temp 100.3 F (37.9 C) (Oral)   Resp 16   Ht 5\' 3"  (1.6 m)   Wt 77.1 kg (170 lb)   SpO2 94%   BMI 30.11 kg/m   Physical Exam  Constitutional: He appears well-developed.  Patient has Down's appearance  HENT:  Head: Atraumatic.  Eyes: Pupils are equal, round, and reactive to light.  Neck: Neck supple.  Cardiovascular: Normal rate.  Pulmonary/Chest:  Harsh breath sounds with some wheezes at bilateral bases.  Abdominal: There is  no tenderness.  Musculoskeletal: He exhibits no edema.  Neurological: He is alert.  Skin: Skin is warm. Capillary refill takes less than 2 seconds.     ED Treatments / Results  Labs (all labs ordered are listed, but only abnormal results are displayed) Labs Reviewed  COMPREHENSIVE METABOLIC PANEL - Abnormal; Notable for the following components:      Result Value   Glucose, Bld 102 (*)    Creatinine, Ser 1.39 (*)    Calcium 8.7 (*)    All other components within normal limits  CBC - Abnormal; Notable for the following components:   RBC 4.19 (*)    HCT 38.6 (*)    All other components within normal limits  URINALYSIS, ROUTINE W REFLEX MICROSCOPIC - Abnormal; Notable for  the following components:   Ketones, ur 20 (*)    All other components within normal limits  LIPASE, BLOOD    EKG  EKG Interpretation None       Radiology Dg Chest 2 View  Result Date: 04/05/2017 CLINICAL DATA:  The patient was seen 5 days ago for cough and vomiting. Symptoms have worsened. History of Down syndrome, norovirus, UTI, EXAM: CHEST  2 VIEW COMPARISON:  PA chest x-ray of May 03, 2016 and PA and lateral chest x-ray of January 07, 2016. FINDINGS: The lungs are adequately inflated. The lung markings are coarse in the lower lobes but this is stable. There is no alveolar infiltrate or pleural effusion. The heart and pulmonary vascularity are normal. The mediastinum is normal in width. The bony thorax exhibits no acute abnormality. IMPRESSION: Stable chronic bronchitic changes.  No alveolar pneumonia nor CHF. Electronically Signed   By: David  [place] M.D.   On: 04/05/2017 14:52    Procedures Procedures (including critical care time)  Medications Ordered in ED Medications  sodium chloride 0.9 % bolus 1,000 mL (1,000 mLs Intravenous New Bag/Given 04/05/17 1728)  ondansetron (ZOFRAN) injection 4 mg (4 mg Intravenous Given 04/05/17 1727)  methylPREDNISolone sodium succinate (SOLU-MEDROL) 125 mg/2 mL injection 125 mg (125 mg Intravenous Given 04/05/17 1836)     Initial Impression / Assessment and Plan / ED Course  I have reviewed the triage vital signs and the nursing notes.  Pertinent labs & imaging results that were available during my care of the patient were reviewed by me and considered in my medical decision making (see chart for details).     Patient with URI symptoms.  Bronchitis on x-ray.  Has had posttussive vomiting.  Recently seen for same.  Lab work reassuring with some mild renal insufficiency.  Tolerate orals here.  Will treat with steroids to help with the wheezing and bronchitis.  Also cough medicines.  Do not think he needs antibiotics at this time.  Flu thought less  likely.  Will discharge home.  Final Clinical Impressions(s) / ED Diagnoses   Final diagnoses:  Upper respiratory tract infection, unspecified type  Non-intractable vomiting without nausea, unspecified vomiting type    ED Discharge Orders        Ordered    predniSONE (DELTASONE) 20 MG tablet  Daily     04/05/17 1916    guaiFENesin-dextromethorphan (ROBITUSSIN DM) 100-10 MG/5ML syrup  3 times daily PRN     04/05/17 1916       Benjiman CorePickering, Jermiyah Ricotta, MD 04/05/17 1924

## 2017-10-18 ENCOUNTER — Encounter (HOSPITAL_COMMUNITY): Payer: Self-pay

## 2017-10-18 ENCOUNTER — Emergency Department (HOSPITAL_COMMUNITY)
Admission: EM | Admit: 2017-10-18 | Discharge: 2017-10-18 | Disposition: A | Discharge status: Home / Self Care | Payer: Medicare Other | Attending: Emergency Medicine | Admitting: Emergency Medicine

## 2017-10-18 ENCOUNTER — Emergency Department (HOSPITAL_COMMUNITY): Payer: Medicare Other

## 2017-10-18 DIAGNOSIS — L03116 Cellulitis of left lower limb: Secondary | ICD-10-CM | POA: Diagnosis not present

## 2017-10-18 DIAGNOSIS — M79672 Pain in left foot: Secondary | ICD-10-CM | POA: Diagnosis present

## 2017-10-18 DIAGNOSIS — Z79899 Other long term (current) drug therapy: Secondary | ICD-10-CM | POA: Insufficient documentation

## 2017-10-18 DIAGNOSIS — L03119 Cellulitis of unspecified part of limb: Secondary | ICD-10-CM

## 2017-10-18 MED ORDER — CLINDAMYCIN HCL 150 MG PO CAPS: 150.0000 mg | ORAL_CAPSULE | Freq: Four times a day (QID) | ORAL | 0 refills | Status: DC

## 2017-10-18 NOTE — ED Triage Notes (Addendum)
Pt presents with c/o left foot pain. Per pt's mom, she reported [place] [facility] [place] but she is unsure of any injury that occurred. Pt woke up this morning and was complaining that his foot was hurting. Ambulatory to room. Pt is also coughing, mom reports this has been happening since Friday.

## 2017-10-18 NOTE — ED Provider Notes (Signed)
[facility]-EMERGENCY DEPT Provider Note   CSN: [id number] Arrival date & time: 10/18/17  [id number]     History   Chief Complaint Chief Complaint  Patient presents with  . Foot Pain  . Cough    HPI Levi Powell is a 32 y.o. male.  32 year old male with history of Down syndrome presents with pain and swelling to the left dorsal portion of his foot.  Patient's mother gives the history.  No fever or chills reported.  Possible injury at an inducement part.  Unknown if patient was stung by an insect.  Pain is worse when he walks better with rest.  He is currently nonverbal.  No treatment used prior to arrival.     Past Medical History:  Diagnosis Date  . Down syndrome     Patient Active Problem List   Diagnosis Date Noted  . Gastroenteritis due to norovirus 04/30/2015  . Intractable vomiting 04/29/2015  . Diarrhea 04/29/2015  . Down syndrome 04/29/2015  . Vomiting 03/20/2015  . 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 Cystitis 03/20/2015  . UTI (lower urinary tract infection) 03/20/2015  . Sepsis, unspecified organism (HCC) 03/20/2015  . Cough     Past Surgical History:  Procedure Laterality Date  . MYRINGOTOMY    . NO PAST SURGERIES          Home Medications    Prior to Admission medications   Medication Sig Start Date End Date Taking? Authorizing Provider  chlorhexidine (HIBICLENS) 4 % external liquid Apply topically daily as needed. Patient not taking: Reported on 11/30/2016 11/02/16   Alene Miresmohundro, Jennifer C, NP  FERROUS SULFATE PO Take 5 mLs by mouth daily. Liquid Iron, unsure of strength    [provider]  guaiFENesin-dextromethorphan (ROBITUSSIN DM) 100-10 MG/5ML syrup Take 5 mLs by mouth 3 (three) times daily as needed for cough. 04/05/17   Benjiman CorePickering, Nathan, MD  omeprazole (PRILOSEC) 20 MG capsule Take 20 mg by mouth daily. 06/01/16   [provider]  ondansetron (ZOFRAN ODT) 4 MG disintegrating tablet Take 1 tablet (4 mg total) by  mouth every 8 (eight) hours as needed for nausea or vomiting. 04/01/17   Gerhard MunchLockwood, Robert, MD  predniSONE (DELTASONE) 20 MG tablet Take 2 tablets (40 mg total) by mouth daily. 04/05/17   Benjiman CorePickering, Nathan, MD    Family History Family History  Problem Relation Age of Onset  . Hypertension Mother   . Cancer Mother     Social History Social History   Tobacco Use  . Smoking status: Never Smoker  . Smokeless tobacco: Never Used  Substance Use Topics  . Alcohol use: No  . Drug use: No     Allergies   Patient has no known allergies.   Review of Systems Review of Systems  All other systems reviewed and are negative.    Physical Exam Updated Vital Signs BP 134/89 (BP Location: Right Arm)   Pulse 100   Temp 98.3 F (36.8 C) (Oral)   Resp 18   SpO2 98%   Physical Exam  Constitutional: He appears well-developed and well-nourished.  Non-toxic appearance. No distress.  HENT:  Head: Normocephalic and atraumatic.  Eyes: Pupils are equal, round, and reactive to light. Conjunctivae, EOM and lids are normal.  Neck: Normal range of motion. Neck supple. No tracheal deviation present. No thyroid mass present.  Cardiovascular: Normal rate, regular rhythm and normal heart sounds. Exam reveals no gallop.  No murmur heard. Pulmonary/Chest: Effort normal and breath sounds normal. No stridor.  No respiratory distress. He has no decreased breath sounds. He has no wheezes. He has no rhonchi. He has no rales.  Abdominal: Soft. Normal appearance and bowel sounds are normal. He exhibits no distension. There is no tenderness. There is no rebound and no CVA tenderness.  Musculoskeletal: Normal range of motion. He exhibits no edema or tenderness.       Feet:  Feet:  Left Foot:  Skin Integrity: Positive for erythema and warmth. Negative for ulcer.  Neurological: He is alert. No cranial nerve deficit or sensory deficit.  Patient at baseline per mother  Skin: Skin is warm and dry. No abrasion and no  rash noted.  Psychiatric: He is attentive.  Nursing note and vitals reviewed.    ED Treatments / Results  Labs (all labs ordered are listed, but only abnormal results are displayed) Labs Reviewed - No data to display  EKG None  Radiology No results found.  Procedures Procedures (including critical care time)  Medications Ordered in ED Medications - No data to display   Initial Impression / Assessment and Plan / ED Course  I have reviewed the triage vital signs and the nursing notes.  Pertinent labs & imaging results that were available during my care of the patient were reviewed by me and considered in my medical decision making (see chart for details).     Patient with possible early cellulitis will place on antibiotics and return precautions given  Final Clinical Impressions(s) / ED Diagnoses   Final diagnoses:  None    ED Discharge Orders    None       Lorre NickAllen, Landin Tallon, MD 10/18/17 [phone]

## 2019-07-27 ENCOUNTER — Encounter: Payer: Self-pay | Admitting: Otolaryngology

## 2019-07-27 ENCOUNTER — Other Ambulatory Visit: Payer: Self-pay

## 2019-08-04 ENCOUNTER — Other Ambulatory Visit: Payer: Self-pay

## 2019-08-04 ENCOUNTER — Other Ambulatory Visit
Admission: RE | Admit: 2019-08-04 | Discharge: 2019-08-04 | Disposition: A | Discharge status: Home / Self Care | Payer: Medicare Other | Source: Ambulatory Visit | Attending: Otolaryngology | Admitting: Otolaryngology

## 2019-08-04 DIAGNOSIS — Z20822 Contact with and (suspected) exposure to covid-19: Secondary | ICD-10-CM | POA: Diagnosis not present

## 2019-08-04 DIAGNOSIS — Z01812 Encounter for preprocedural laboratory examination: Secondary | ICD-10-CM | POA: Insufficient documentation

## 2019-08-04 LAB — SARS CORONAVIRUS 2 (TAT 6-24 HRS): SARS Coronavirus 2: NEGATIVE

## 2019-08-07 NOTE — Discharge Instructions (Signed)
[facility] DISCHARGE INSTRUCTIONS FOR MYRINGOTOMY AND TUBE INSERTION   [facility] PAUL JUENGEL, M.D. CHAPMAN T. MCQUEEN, M.D. SCOTT BENNETT, M.D. CREIGHTON VAUGHT, M.D.  Diet:   After surgery, the patient should take only liquids and foods as tolerated.  The patient may then have a regular diet after the effects of anesthesia have worn off, usually about four to six hours after surgery.  Activities:   The patient should rest until the effects of anesthesia have worn off.  After this, there are no restrictions on the normal daily activities.  Medications:   You will be given antibiotic drops to be used in the ears postoperatively.  It is recommended to use 4 drops 2 times a day for 5 days, then the drops should be saved for possible future use.  The tubes should not cause any discomfort to the patient, but if there is any question, Tylenol should be given according to the instructions for the age of the patient.  Other medications should be continued normally.  Precautions:   Should there be recurrent drainage after the tubes are placed, the drops should be used for approximately 3-4days.  If it does not clear, you should call the ENT office.  Earplugs:   Earplugs are only needed for those who are going to be submerged under water.  When taking a bath or shower and using a cup or showerhead to rinse hair, it is not necessary to wear earplugs.  These come in a variety of fashions, all of which can be obtained at our office.  However, if one is not able to come by the office, then silicone plugs can be found at most pharmacies.  It is not advised to stick anything in the ear that is not approved as an earplug.  Silly putty is not to be used as an earplug.  Swimming is allowed in patients after ear tubes are inserted, however, they must wear earplugs if they are going to be submerged under water.  For those children who are going to be swimming a lot, it is  recommended to use a fitted ear mold, which can be made by our audiologist.  If discharge is noticed from the ears, this most likely represents an ear infection.  We would recommend getting your eardrops and using them as indicated above.  If it does not clear, then you should call the ENT office.  For follow up, the patient should return to the ENT office three weeks postoperatively and then every six months as required by the doctor.   General Anesthesia, Adult, Care After This sheet gives you information about how to care for yourself after your procedure. Your health care provider may also give you more specific instructions. If you have problems or questions, contact your health care provider. What can I expect after the procedure? After the procedure, the following side effects are common:  Pain or discomfort at the IV site.  Nausea.  Vomiting.  Sore throat.  Trouble concentrating.  Feeling cold or chills.  Weak or tired.  Sleepiness and fatigue.  Soreness and body aches. These side effects can affect parts of the body that were not involved in surgery. Follow these instructions at home:  For at least 24 hours after the procedure:  Have a responsible adult stay with you. It is important to have someone help care for you until you are awake and alert.  Rest as needed.  Do not: ? Participate in activities   in which you could fall or become injured. ? Drive. ? Use heavy machinery. ? Drink alcohol. ? Take sleeping pills or medicines that cause drowsiness. ? Make important decisions or sign legal documents. ? Take care of children on your own. Eating and drinking  Follow any instructions from your health care provider about eating or drinking restrictions.  When you feel hungry, start by eating small amounts of foods that are soft and easy to digest (bland), such as toast. Gradually return to your regular diet.  Drink enough fluid to keep your urine pale yellow.  If  you vomit, rehydrate by drinking water, juice, or clear broth. General instructions  If you have sleep apnea, surgery and certain medicines can increase your risk for breathing problems. Follow instructions from your health care provider about wearing your sleep device: ? Anytime you are sleeping, including during daytime naps. ? While taking prescription pain medicines, sleeping medicines, or medicines that make you drowsy.  Return to your normal activities as told by your health care provider. Ask your health care provider what activities are safe for you.  Take over-the-counter and prescription medicines only as told by your health care provider.  If you smoke, do not smoke without supervision.  Keep all follow-up visits as told by your health care provider. This is important. Contact a health care provider if:  You have nausea or vomiting that does not get better with medicine.  You cannot eat or drink without vomiting.  You have pain that does not get better with medicine.  You are unable to pass urine.  You develop a skin rash.  You have a fever.  You have redness around your IV site that gets worse. Get help right away if:  You have difficulty breathing.  You have chest pain.  You have blood in your urine or stool, or you vomit blood. Summary  After the procedure, it is common to have a sore throat or nausea. It is also common to feel tired.  Have a responsible adult stay with you for the first 24 hours after general anesthesia. It is important to have someone help care for you until you are awake and alert.  When you feel hungry, start by eating small amounts of foods that are soft and easy to digest (bland), such as toast. Gradually return to your regular diet.  Drink enough fluid to keep your urine pale yellow.  Return to your normal activities as told by your health care provider. Ask your health care provider what activities are safe for you. This  information is not intended to replace advice given to you by your health care provider. Make sure you discuss any questions you have with your health care provider. Document Revised: 02/19/2017 Document Reviewed: 10/02/2016 Elsevier Patient Education  2020 Elsevier Inc[employer]  

## 2019-08-08 ENCOUNTER — Encounter: Payer: Self-pay | Admitting: Otolaryngology

## 2019-08-08 ENCOUNTER — Other Ambulatory Visit: Payer: Self-pay

## 2019-08-08 ENCOUNTER — Encounter
Admission: RE | Disposition: A | Discharge status: Home / Self Care | Payer: Self-pay | Source: Home / Self Care | Attending: Otolaryngology

## 2019-08-08 ENCOUNTER — Ambulatory Visit: Payer: Medicare Other | Admitting: Anesthesiology

## 2019-08-08 ENCOUNTER — Ambulatory Visit
Admission: RE | Admit: 2019-08-08 | Discharge: 2019-08-08 | Disposition: A | Discharge status: Home / Self Care | Payer: Medicare Other | Attending: Otolaryngology | Admitting: Otolaryngology

## 2019-08-08 DIAGNOSIS — Q909 Down syndrome, unspecified: Secondary | ICD-10-CM | POA: Diagnosis not present

## 2019-08-08 DIAGNOSIS — Z79899 Other long term (current) drug therapy: Secondary | ICD-10-CM | POA: Insufficient documentation

## 2019-08-08 DIAGNOSIS — H6504 Acute serous otitis media, recurrent, right ear: Secondary | ICD-10-CM | POA: Insufficient documentation

## 2019-08-08 DIAGNOSIS — K219 Gastro-esophageal reflux disease without esophagitis: Secondary | ICD-10-CM | POA: Diagnosis not present

## 2019-08-08 HISTORY — PX: MYRINGOTOMY WITH TUBE PLACEMENT: SHX5663

## 2019-08-08 SURGERY — MYRINGOTOMY WITH TUBE PLACEMENT
Anesthesia: General | Site: Ear | Laterality: Right

## 2019-08-08 MED ORDER — GLYCOPYRROLATE 0.2 MG/ML IJ SOLN
INTRAMUSCULAR | Status: DC | PRN
  Administered 2019-08-08: 1 mg via INTRAVENOUS

## 2019-08-08 MED ORDER — FENTANYL CITRATE (PF) 100 MCG/2ML IJ SOLN
INTRAMUSCULAR | Status: DC | PRN
  Administered 2019-08-08: 40 ug via INTRAVENOUS

## 2019-08-08 MED ORDER — MIDAZOLAM HCL 5 MG/5ML IJ SOLN
INTRAMUSCULAR | Status: DC | PRN
  Administered 2019-08-08: 1 mg via INTRAVENOUS

## 2019-08-08 MED ORDER — CIPROFLOXACIN-DEXAMETHASONE 0.3-0.1 % OT SUSP
OTIC | Status: DC | PRN
  Administered 2019-08-08: 4 [drp] via OTIC

## 2019-08-08 MED ORDER — ONDANSETRON HCL 4 MG/2ML IJ SOLN
INTRAMUSCULAR | Status: DC | PRN
  Administered 2019-08-08: 4 mg via INTRAVENOUS

## 2019-08-08 MED ORDER — ACETAMINOPHEN 160 MG/5ML PO SOLN
325.0000 mg | ORAL | Status: DC | PRN
  Administered 2019-08-08: 650 mg via ORAL

## 2019-08-08 MED ORDER — DEXAMETHASONE SODIUM PHOSPHATE 4 MG/ML IJ SOLN
INTRAMUSCULAR | Status: DC | PRN
  Administered 2019-08-08: 4 mg via INTRAVENOUS

## 2019-08-08 MED ORDER — LACTATED RINGERS IV SOLN
10.0000 mL/h | INTRAVENOUS | Status: DC
  Administered 2019-08-08: 10 mL/h via INTRAVENOUS

## 2019-08-08 MED ORDER — PROPOFOL 10 MG/ML IV BOLUS
INTRAVENOUS | Status: DC | PRN
  Administered 2019-08-08: 130 mg via INTRAVENOUS

## 2019-08-08 MED ORDER — ACETAMINOPHEN 325 MG PO TABS: 325.0000 mg | ORAL_TABLET | ORAL | Status: DC | PRN

## 2019-08-08 SURGICAL SUPPLY — 8 items
BLADE MYR LANCE NRW W/HDL (BLADE) ×2 IMPLANT
CANISTER SUCT 1200ML W/VALVE (MISCELLANEOUS) ×2 IMPLANT
COTTONBALL LRG STERILE PKG (GAUZE/BANDAGES/DRESSINGS) ×2 IMPLANT
GLOVE BIO SURGEON STRL SZ7.5 (GLOVE) ×4 IMPLANT
TOWEL OR 17X26 4PK STRL BLUE (TOWEL DISPOSABLE) ×2 IMPLANT
TUBE EAR T 1.27X4.5 GO LF (OTOLOGIC RELATED) ×2 IMPLANT
TUBING CONN 6MMX3.1M (TUBING) ×2
TUBING SUCTION CONN 0.25 STRL (TUBING) ×1 IMPLANT

## 2019-08-08 NOTE — Op Note (Signed)
08/08/2019  8:02 AM    Efraim Kaufmann  [id number]   Pre-Op Diagnosis:  RECURRENT ACUTE OTITIS MEDIA  Post-op Diagnosis: SAME  Procedure: Right myringotomy with T-tube placement  Surgeon:  Sandi Mealy., MD  Anesthesia:  General anesthesia with masked ventilation  EBL:  Minimal  Complications:  None  Findings: posterior superior shallow retraction pocket without keratin debris. Mucoid effusion  Procedure: The patient was taken to the Operating Room and placed in the supine position.  After induction of general anesthesia with mask ventilation, the right ear was evaluated under the operating microscope and the canal cleaned. The findings were as described above.  An anterior inferior radial myringotomy incision was performed.  Mucous was suctioned from the middle ear.  A T-tube was placed without difficulty.  Ciprodex otic solution was instilled into the external canal, and insufflated into the middle ear.  A cotton ball was placed at the external meatus.  The patient was then returned to the anesthesiologist for awakening, and was taken to the Recovery Room in stable condition.  Cultures:  None.  Disposition:   PACU then discharge home  Plan: Antibiotic ear drops as prescribed and water precautions.  Recheck my office three weeks.  Sandi Mealy 08/08/2019 8:02 AM

## 2019-08-08 NOTE — H&P (Signed)
History and physical reviewed and will be scanned in later. No change in medical status reported by the patient or family, appears stable for surgery. All questions regarding the procedure answered, and patient (or family if a child) expressed understanding of the procedure. ? ?Levi Powell ?@TODAY@ ?

## 2019-08-08 NOTE — Anesthesia Procedure Notes (Signed)
Procedure Name: LMA Insertion Date/Time: 08/08/2019 7:49 AM Performed by: Jimmy Picket, CRNA Pre-anesthesia Checklist: Patient identified, Emergency Drugs available, Suction available, Timeout performed and Patient being monitored Patient Re-evaluated:Patient Re-evaluated prior to induction Oxygen Delivery Method: Circle system utilized Preoxygenation: Pre-oxygenation with 100% oxygen Induction Type: IV induction LMA: LMA inserted LMA Size: 4.0 Number of attempts: 1 Placement Confirmation: positive ETCO2 and breath sounds checked- equal and bilateral Tube secured with: Tape

## 2019-08-08 NOTE — Transfer of Care (Signed)
Immediate Anesthesia Transfer of Care Note  Patient: Levi Powell  Procedure(s) Performed: MYRINGOTOMY WITH T-TUBE PLACEMENT (Right Ear)  Patient Location: PACU  Anesthesia Type: General  Level of Consciousness: awake, alert  and patient cooperative  Airway and Oxygen Therapy: Patient Spontanous Breathing and Patient connected to supplemental oxygen  Post-op Assessment: Post-op Vital signs reviewed, Patient's Cardiovascular Status Stable, Respiratory Function Stable, Patent Airway and No signs of Nausea or vomiting  Post-op Vital Signs: Reviewed and stable  Complications: No apparent anesthesia complications

## 2019-08-08 NOTE — Anesthesia Preprocedure Evaluation (Signed)
Anesthesia Evaluation  Patient identified by MRN, date of birth, ID band  History of Anesthesia Complications Negative for: history of anesthetic complications  Airway Mallampati: IV  TM Distance: >3 FB Neck ROM: Full  Mouth opening: Limited Mouth Opening  Dental  (+) Poor Dentition   Pulmonary neg pulmonary ROS,    Pulmonary exam normal        Cardiovascular Exercise Tolerance: Good negative cardio ROS Normal cardiovascular exam     Neuro/Psych Down's syndrome, able to comprehend but responds yes and no with hand movements    GI/Hepatic Neg liver ROS, GERD  Medicated and Controlled,  Endo/Other  Obesity BMI 33  Renal/GU negative Renal ROS     Musculoskeletal   Abdominal   Peds  Hematology negative hematology ROS (+)   Anesthesia Other Findings   Reproductive/Obstetrics                            Anesthesia Physical Anesthesia Plan  ASA: III  Anesthesia Plan: General   Post-op Pain Management:    Induction: Intravenous  PONV Risk Score and Plan:   Airway Management Planned: LMA  Additional Equipment: None  Intra-op Plan:   Post-operative Plan:   Informed Consent: I have reviewed the patients History and Physical, chart, labs and discussed the procedure including the risks, benefits and alternatives for the proposed anesthesia with the patient or authorized representative who has indicated his/her understanding and acceptance.       Plan Discussed with: CRNA  Anesthesia Plan Comments:         Anesthesia Quick Evaluation

## 2019-08-08 NOTE — Anesthesia Postprocedure Evaluation (Signed)
Anesthesia Post Note  Patient: Levi Powell  Procedure(s) Performed: MYRINGOTOMY WITH T-TUBE PLACEMENT (Right Ear)     Patient location during evaluation: PACU Anesthesia Type: General Level of consciousness: awake and alert Pain management: pain level controlled Vital Signs Assessment: post-procedure vital signs reviewed and stable Respiratory status: spontaneous breathing, nonlabored ventilation, respiratory function stable and patient connected to nasal cannula oxygen Cardiovascular status: blood pressure returned to baseline and stable Postop Assessment: no apparent nausea or vomiting Anesthetic complications: no    Levi Powell

## 2019-08-09 ENCOUNTER — Encounter: Payer: Self-pay | Admitting: *Deleted

## 2020-08-20 ENCOUNTER — Emergency Department (HOSPITAL_COMMUNITY): Payer: Medicare Other

## 2020-08-20 ENCOUNTER — Encounter (HOSPITAL_COMMUNITY): Payer: Self-pay

## 2020-08-20 ENCOUNTER — Emergency Department (HOSPITAL_COMMUNITY)
Admission: EM | Admit: 2020-08-20 | Discharge: 2020-08-20 | Disposition: A | Discharge status: Home / Self Care | Payer: Medicare Other | Attending: Emergency Medicine | Admitting: Emergency Medicine

## 2020-08-20 ENCOUNTER — Other Ambulatory Visit: Payer: Self-pay

## 2020-08-20 DIAGNOSIS — S99922A Unspecified injury of left foot, initial encounter: Secondary | ICD-10-CM | POA: Diagnosis present

## 2020-08-20 DIAGNOSIS — M79672 Pain in left foot: Secondary | ICD-10-CM | POA: Diagnosis not present

## 2020-08-20 DIAGNOSIS — W1830XA Fall on same level, unspecified, initial encounter: Secondary | ICD-10-CM | POA: Insufficient documentation

## 2020-08-20 HISTORY — DX: Gastro-esophageal reflux disease without esophagitis: K21.9

## 2020-08-20 NOTE — Progress Notes (Signed)
Orthopedic Tech Progress Note Patient Details:  Levi Powell 1985/07/14 [id number]  Ortho Devices Type of Ortho Device: CAM walker Ortho Device/Splint Location: left Ortho Device/Splint Interventions: Application   Post Interventions Patient Tolerated: Well Instructions Provided: Care of device  Saul Fordyce 08/20/2020, 9:09 PM

## 2020-08-20 NOTE — ED Provider Notes (Signed)
[facility]-EMERGENCY DEPT Provider Note   CSN: [id number] Arrival date & time: 08/20/20  1423     History Chief Complaint  Patient presents with   Foot Injury    Levi Powell is a 35 y.o. male with past medical history of Down syndrome who is in the ED accompanied by his parents with complaints of left foot pain.  I obtained history predominantly from his parents.  Evidently he sustained a mechanical fall few days ago and recently began complaining of left-sided foot pain.  His parents noticed that his left foot appeared clinically more swollen than his right foot prompting them to bring him here to the ED for evaluation.  They expressed concern for fracture or dislocation.  Patient does not appear to be in any acute distress.  He points towards his left foot, mid 2nd-3rd metatarsals, when describing his pain symptoms.  He is ambulatory, albeit with antalgic gait favoring left foot.  He denies any numbness or weakness.  No other injuries.  Level 5 caveat due to intellectual disability.  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   Cystitis 03/20/2015   UTI (lower urinary tract infection) 03/20/2015   Sepsis, unspecified organism (HCC) 03/20/2015   Cough     Past Surgical History:  Procedure Laterality Date   MYRINGOTOMY     MYRINGOTOMY WITH TUBE PLACEMENT Right 08/08/2019   Procedure: MYRINGOTOMY WITH T-TUBE PLACEMENT;  Surgeon: Geanie Logan, MD;  [facility];  Service: ENT;  Laterality: Right;  needs to stay first   NO PAST SURGERIES         Family History  Problem Relation Age of Onset   Hypertension Mother    Cancer Mother     Migraines Father     Social History   Tobacco Use   Smoking status: Never   Smokeless tobacco: Never  Vaping Use   Vaping Use: Never used  Substance Use Topics   Alcohol use: No   Drug use: No    Home Medications Prior to Admission medications   Medication Sig Start Date End Date Taking? Authorizing Provider  FERROUS SULFATE PO Take 5 mLs by mouth daily. Liquid Iron, unsure of strength    [provider]  omeprazole (PRILOSEC) 20 MG capsule Take 20 mg by mouth daily. 06/01/16   [provider]    Allergies    Patient has no known allergies.  Review of Systems   Review of Systems  Unable to perform ROS: Patient nonverbal   Physical Exam Updated Vital Signs BP 128/88   Pulse 81   Temp 98.1 F (36.7 C) (Oral)   Resp 17   Ht 5' (1.524 m)   Wt 86.2 kg   SpO2 95%   BMI 37.11 kg/m   Physical Exam Vitals and nursing note reviewed. Exam conducted with a chaperone present.  Constitutional:      Appearance: Normal appearance.  HENT:     Head: Normocephalic and atraumatic.  Eyes:     General: No scleral icterus.    Conjunctiva/sclera: Conjunctivae normal.  Cardiovascular:     Rate and Rhythm: Normal rate.  Pulses: Normal pulses.  Pulmonary:     Effort: Pulmonary effort is normal. No respiratory distress.  Musculoskeletal:     Comments: Left foot: Tenderness over midshaft 2nd-3rd metatarsal.  Pedal edema noted.  Pedal pulse intact and symmetric with contralateral foot.  Can wiggle toes.  Brisk capillary refill.  Sensation intact throughout. Left ankle: No significant bony tenderness.  No overlying skin changes.  Dorsiflexion limited due to pain.  Achilles tendon is palpable. No tenderness superiorly in relation to left foot.  Can range left knee.  No significant knee or tibial/fibular tenderness. Ambulatory with antalgic gait.  Skin:    General: Skin is dry.  Neurological:     Mental Status: He is alert and oriented to person, place, and time.      GCS: GCS eye subscore is 4. GCS verbal subscore is 5. GCS motor subscore is 6.  Psychiatric:        Mood and Affect: Mood normal.        Behavior: Behavior normal.        Thought Content: Thought content normal.    ED Results / Procedures / Treatments   Labs (all labs ordered are listed, but only abnormal results are displayed) Labs Reviewed - No data to display  EKG None  Radiology DG Foot Complete Left  Result Date: 08/20/2020 CLINICAL DATA:  Left foot injury. EXAM: LEFT FOOT - COMPLETE 3+ VIEW COMPARISON:  10/18/2017 FINDINGS: There is no evidence of fracture or dislocation. There is no evidence of arthropathy or other focal bone abnormality. Soft tissues are unremarkable. IMPRESSION: Negative. Electronically Signed   By: Marlan Palau M.D.   On: 08/20/2020 17:20    Procedures Procedures   Medications Ordered in ED Medications - No data to display  ED Course  I have reviewed the triage vital signs and the nursing notes.  Pertinent labs & imaging results that were available during my care of the patient were reviewed by me and considered in my medical decision making (see chart for details).    MDM Rules/Calculators/A&P                          Levi Powell was evaluated in Emergency Department on 08/20/2020 for the symptoms described in the history of present illness. He was evaluated in the context of the global COVID-19 pandemic, which necessitated consideration that the patient might be at risk for infection with the SARS-CoV-2 virus that causes COVID-19. Institutional protocols and algorithms that pertain to the evaluation of patients at risk for COVID-19 are in a state of rapid change based on information released by regulatory bodies including the CDC and federal and state organizations. These policies and algorithms were followed during the patient's care in the ED.  I personally reviewed patient's medical chart and all notes from triage and staff during  today's encounter. I have also ordered and reviewed all labs and imaging that I felt to be medically necessary in the evaluation of this patient's complaints and with consideration of their physical exam. If needed, translation services were available and utilized.   Plain films of left foot are personally reviewed and demonstrate no acute bony findings.  No obvious soft tissue abnormalities.  Suspect that his pedal edema is in context to foot contusion.  Mechanism of injury is not particularly clear given patient's Down syndrome.  He is a poor historian and parents suspect this was secondary to fall sustained over the weekend.  He  is amatory with antalgic gait.  Will refer patient to orthopedics for ongoing evaluation and management.  Will place in cam walker given that he would likely not fare well with crutches given intellectual disability.  Emphasized importance of ibuprofen 600 mg every 6 hours as needed for pain and inflammation.  He will elevate the foot whenever possible.  Weightbearing as tolerated.  ER return precautions discussed.  Parents voiced understanding and are agreeable to the plan.  Final Clinical Impression(s) / ED Diagnoses Final diagnoses:  Injury of left foot, initial encounter    Rx / DC Orders ED Discharge Orders     None        Elvera Maria 08/20/20 2216    Benjiman Core, MD 08/21/20 [phone]

## 2020-08-20 NOTE — ED Notes (Signed)
Ortho called for cam boot.

## 2020-08-20 NOTE — ED Triage Notes (Signed)
Patient's father states they were walking at a flea market Saturday and the pain turned his left foot and fell to his knees. No head injury and no LOC.  Patient has swelling and pain to he left foot.

## 2020-08-20 NOTE — Discharge Instructions (Addendum)
X-rays did not show any fracture or dislocation.  Please elevate the left foot whenever possible.  Weightbearing as tolerated.  I recommend ibuprofen 600 mg every 6 hours as needed for pain and inflammation.  Please call the office of Dr. Roda Shutters to schedule appointment for ongoing evaluation and management.  He may also follow-up with his primary care provider.  Return to the ED or seek immediate medical attention for new or worsening symptoms.

## 2020-09-03 ENCOUNTER — Other Ambulatory Visit: Payer: Self-pay

## 2020-09-03 ENCOUNTER — Ambulatory Visit (INDEPENDENT_AMBULATORY_CARE_PROVIDER_SITE_OTHER): Payer: Medicare Other | Admitting: Family Medicine

## 2020-09-03 ENCOUNTER — Ambulatory Visit (INDEPENDENT_AMBULATORY_CARE_PROVIDER_SITE_OTHER): Payer: Medicare Other

## 2020-09-03 ENCOUNTER — Encounter: Payer: Self-pay | Admitting: Family Medicine

## 2020-09-03 DIAGNOSIS — M25572 Pain in left ankle and joints of left foot: Secondary | ICD-10-CM

## 2020-09-03 DIAGNOSIS — M25571 Pain in right ankle and joints of right foot: Secondary | ICD-10-CM

## 2020-09-03 MED ORDER — DICLOFENAC SODIUM 1 % EX GEL: 4.0000 g | Freq: Four times a day (QID) | CUTANEOUS | 6 refills | Status: AC | PRN

## 2020-09-03 NOTE — Progress Notes (Signed)
Office Visit Note   Patient: Levi Powell           Date of Birth: December 28, 1985           MRN: [id number] Visit Date: 09/03/2020 Requested by: Jerl Mina, MD [address] [facility] [place],  [place] [address] PCP: Jerl Mina, MD  Subjective: Chief Complaint  Patient presents with   Left Foot - Pain, Injury    DOI 08/18/20 Got foot trapped in the door while coming in the house. Went to ED on 08/20/20 because he started limping the day after and the pain/swelling was worse 2 days later. Was given a cam boot. The patient would not wear it - the foot hurt worse in the boot. The pain is better - walking much better. Still has swelling on the top of the foot.     HPI: He is here with left foot pain.  On June 19 he was coming into the house, his left foot got trapped in the screen door as it was closing.  His shoe fell off, but he seemed to be okay at that point.  The next day he was in a lot of pain and has been limping since then.  He went to the urgent care on June 21 and had x-rays obtained which were negative for obvious fracture.  He was given a fracture boot which made his pain worse so he stopped wearing it.  He is seeming to improve in the past few days, but still walking with a limp and there is some soft tissue swelling on the dorsum of his foot.  His second toe seemed to be pretty tender per father's report.  Patient has Down syndrome.                ROS:   All other systems were reviewed and are negative.  Objective: Vital Signs: There were no vitals taken for this visit.  Physical Exam:  General:  Alert and oriented, in no acute distress. Pulm:  Breathing unlabored. Psy:  Normal mood, congruent affect. Skin/left foot: There is 1+ soft tissue swelling dorsally near the second MTP joint.  He is mildly tender to palpation there.  Extensor and flexor tendon function is intact.  No significant pain from plantar approach to palpation of the second toe.  Mild  tenderness on the dorsum of the MTP joint.   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  No acute fracture seen.   Assessment & Plan: Left foot pain almost 3 weeks status post contusion -We will try Voltaren gel for the next several days.  If symptoms persist beyond another week or 2, then consider MRI scan.     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   Cystitis 03/20/2015   UTI (lower urinary tract infection) 03/20/2015   Sepsis, unspecified organism (HCC) 03/20/2015   Cough    Past Medical History:  Diagnosis Date   Down syndrome    GERD (gastroesophageal reflux disease)     Family History  Problem Relation Age of Onset   Hypertension Mother    Cancer Mother    Migraines  Father     Past Surgical History:  Procedure Laterality Date   MYRINGOTOMY     MYRINGOTOMY WITH TUBE PLACEMENT Right 08/08/2019   Procedure: MYRINGOTOMY WITH T-TUBE PLACEMENT;  Surgeon: Geanie Logan, MD;  Location: [facility];  Service: ENT;  Laterality: Right;  needs to stay first   NO PAST SURGERIES     Social History   Occupational History   Not on file  Tobacco Use   Smoking status: Never   Smokeless tobacco: Never  Vaping Use   Vaping Use: Never used  Substance and Sexual Activity   Alcohol use: No   Drug use: No   Sexual activity: Not on file

## 2020-12-02 ENCOUNTER — Emergency Department (HOSPITAL_COMMUNITY): Payer: Medicare Other

## 2020-12-02 ENCOUNTER — Encounter (HOSPITAL_COMMUNITY): Payer: Self-pay

## 2020-12-02 ENCOUNTER — Emergency Department (HOSPITAL_COMMUNITY)
Admission: EM | Admit: 2020-12-02 | Discharge: 2020-12-02 | Disposition: A | Discharge status: Home / Self Care | Payer: Medicare Other | Attending: Emergency Medicine | Admitting: Emergency Medicine

## 2020-12-02 DIAGNOSIS — W010XXA Fall on same level from slipping, tripping and stumbling without subsequent striking against object, initial encounter: Secondary | ICD-10-CM | POA: Insufficient documentation

## 2020-12-02 DIAGNOSIS — S9031XA Contusion of right foot, initial encounter: Secondary | ICD-10-CM

## 2020-12-02 DIAGNOSIS — S99921A Unspecified injury of right foot, initial encounter: Secondary | ICD-10-CM | POA: Diagnosis present

## 2020-12-02 NOTE — ED Provider Notes (Signed)
[facility]-EMERGENCY DEPT Provider Note   CSN: [id number] Arrival date & time: 12/02/20  0945     History Chief Complaint  Patient presents with   Foot Injury    Levi Powell is a 35 y.o. male.  35 year old male with prior medical history as detailed below presents for evaluation.  Patient is presenting with his father.  The father provides majority of history.  Patient was limping this morning.  The father is concerned that the patient may have tripped and injured his right foot this morning.  Patient is without other complaint.  He localizes pain to the right midfoot.  He is ambulatory.  No recent injury or fall per report.  The history is provided by the patient.  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   Cystitis 03/20/2015   UTI (lower urinary tract infection) 03/20/2015   Sepsis, unspecified organism (HCC) 03/20/2015   Cough     Past Surgical History:  Procedure Laterality Date   MYRINGOTOMY     MYRINGOTOMY WITH TUBE PLACEMENT Right 08/08/2019   Procedure: MYRINGOTOMY WITH T-TUBE PLACEMENT;  Surgeon: Geanie Logan, MD;  Location: [facility];  Service: ENT;  Laterality: Right;  needs to stay first   NO PAST SURGERIES         Family History  Problem Relation Age of Onset   Hypertension Mother    Cancer Mother    Migraines Father     Social History   Tobacco Use   Smoking status: Never   Smokeless tobacco: Never  Vaping Use   Vaping Use: Never used  Substance  Use Topics   Alcohol use: No   Drug use: No    Home Medications Prior to Admission medications   Medication Sig Start Date End Date Taking? Authorizing Provider  diclofenac Sodium (VOLTAREN) 1 % GEL Apply 4 g topically 4 (four) times daily as needed. 09/03/20   Hilts, Casimiro Needle, MD  ibuprofen (ADVIL) 800 MG tablet Take 800 mg by mouth every 8 (eight) hours as needed.    [provider]  omeprazole (PRILOSEC) 20 MG capsule Take 20 mg by mouth daily. 06/01/16   [provider]    Allergies    Patient has no known allergies.  Review of Systems   Review of Systems  All other systems reviewed and are negative.  Physical Exam Updated Vital Signs BP 134/87   Pulse 73   Temp 98.3 F (36.8 C) (Oral)   Resp 16   SpO2 100%   Physical Exam Vitals and nursing note reviewed.  Constitutional:      General: He is not in acute distress.    Appearance: Normal appearance. He is well-developed.  HENT:     Head: Normocephalic and atraumatic.  Eyes:     Conjunctiva/sclera: Conjunctivae normal.     Pupils: Pupils are equal, round, and reactive to light.  Cardiovascular:     Rate and Rhythm: Normal rate and regular rhythm.  Heart sounds: Normal heart sounds.  Pulmonary:     Effort: Pulmonary effort is normal. No respiratory distress.     Breath sounds: Normal breath sounds.  Abdominal:     General: There is no distension.     Palpations: Abdomen is soft.     Tenderness: There is no abdominal tenderness.  Musculoskeletal:        General: No deformity. Normal range of motion.     Cervical back: Normal range of motion and neck supple.     Comments: Mild tenderness with palpation along the medial aspect of the right foot.  No overlying erythema or edema noted.  No contusion noted.  Patient with mildly antalgic gait.  Skin:    General: Skin is warm and dry.  Neurological:     General: No focal deficit present.     Mental Status: He is alert and oriented to person, place,  and time.    ED Results / Procedures / Treatments   Labs (all labs ordered are listed, but only abnormal results are displayed) Labs Reviewed - No data to display  EKG None  Radiology DG Foot Complete Right  Result Date: 12/02/2020 CLINICAL DATA:  Awoke this morning with right foot pain. EXAM: RIGHT FOOT COMPLETE - 3+ VIEW COMPARISON:  None. FINDINGS: No fracture or bone lesion. Joints are normally spaced and aligned.  No arthropathic changes. Midfoot soft tissue swelling. IMPRESSION: 1. Midfoot soft tissue swelling, but no fracture or joint abnormality. Electronically Signed   By: Amie Portland M.D.   On: 12/02/2020 11:45    Procedures Procedures   Medications Ordered in ED Medications - No data to display  ED Course  I have reviewed the triage vital signs and the nursing notes.  Pertinent labs & imaging results that were available during my care of the patient were reviewed by me and considered in my medical decision making (see chart for details).    MDM Rules/Calculators/A&P                           MDM  MSE complete  Levi Powell was evaluated in Emergency Department on 12/02/2020 for the symptoms described in the history of present illness. He was evaluated in the context of the global COVID-19 pandemic, which necessitated consideration that the patient might be at risk for infection with the SARS-CoV-2 virus that causes COVID-19. Institutional protocols and algorithms that pertain to the evaluation of patients at risk for COVID-19 are in a state of rapid change based on information released by regulatory bodies including the CDC and federal and state organizations. These policies and algorithms were followed during the patient's care in the ED.  Patient is presenting with complaint of right foot pain.  Patient without clear inciting event.  Plain films obtained are without significant abnormality.    Patient already has diclofenac gel at home.  Patient's  father reports that this worked well in the past to her prior complaint.  Importance of close follow-up is stressed.  Strict return precautions given and understood.   Final Clinical Impression(s) / ED Diagnoses Final diagnoses:  Contusion of right foot, initial encounter    Rx / DC Orders ED Discharge Orders     None        Wynetta Fines, MD 12/02/20 1203

## 2020-12-02 NOTE — ED Triage Notes (Signed)
Pt arrived via POV, with parent. Per father, pt woke this morning, limping, point to right foot.

## 2020-12-02 NOTE — Discharge Instructions (Signed)
Return for any problem.  Continue use of diclofenac gel as previously prescribed.

## 2020-12-02 NOTE — ED Notes (Signed)
Father at bedside.

## 2020-12-06 ENCOUNTER — Other Ambulatory Visit: Payer: Self-pay | Admitting: Family Medicine

## 2020-12-06 ENCOUNTER — Other Ambulatory Visit (HOSPITAL_COMMUNITY): Payer: Self-pay | Admitting: Family Medicine

## 2020-12-06 DIAGNOSIS — R222 Localized swelling, mass and lump, trunk: Secondary | ICD-10-CM

## 2020-12-18 ENCOUNTER — Ambulatory Visit
Admission: RE | Admit: 2020-12-18 | Discharge: 2020-12-18 | Disposition: A | Discharge status: Home / Self Care | Payer: Medicare Other | Source: Ambulatory Visit | Attending: Family Medicine | Admitting: Family Medicine

## 2020-12-18 DIAGNOSIS — R222 Localized swelling, mass and lump, trunk: Secondary | ICD-10-CM | POA: Insufficient documentation

## 2021-03-02 DIAGNOSIS — K5792 Diverticulitis of intestine, part unspecified, without perforation or abscess without bleeding: Secondary | ICD-10-CM

## 2021-03-02 HISTORY — DX: Diverticulitis of intestine, part unspecified, without perforation or abscess without bleeding: K57.92

## 2021-08-30 DIAGNOSIS — A419 Sepsis, unspecified organism: Secondary | ICD-10-CM

## 2021-08-30 HISTORY — DX: Sepsis, unspecified organism: A41.9

## 2021-09-26 ENCOUNTER — Emergency Department (HOSPITAL_COMMUNITY): Payer: Medicare Other

## 2021-09-26 ENCOUNTER — Encounter (HOSPITAL_COMMUNITY): Payer: Self-pay | Admitting: Emergency Medicine

## 2021-09-26 ENCOUNTER — Inpatient Hospital Stay (HOSPITAL_COMMUNITY)
Admission: EM | Admit: 2021-09-26 | Discharge: 2021-09-29 | DRG: 872 | Disposition: A | Discharge status: Home / Self Care | Payer: Medicare Other | Attending: Internal Medicine | Admitting: Internal Medicine

## 2021-09-26 DIAGNOSIS — K5792 Diverticulitis of intestine, part unspecified, without perforation or abscess without bleeding: Secondary | ICD-10-CM | POA: Diagnosis present

## 2021-09-26 DIAGNOSIS — R6520 Severe sepsis without septic shock: Secondary | ICD-10-CM | POA: Diagnosis present

## 2021-09-26 DIAGNOSIS — E876 Hypokalemia: Secondary | ICD-10-CM | POA: Diagnosis not present

## 2021-09-26 DIAGNOSIS — Q909 Down syndrome, unspecified: Secondary | ICD-10-CM

## 2021-09-26 DIAGNOSIS — A419 Sepsis, unspecified organism: Secondary | ICD-10-CM | POA: Diagnosis not present

## 2021-09-26 DIAGNOSIS — K219 Gastro-esophageal reflux disease without esophagitis: Secondary | ICD-10-CM | POA: Diagnosis present

## 2021-09-26 DIAGNOSIS — Z20822 Contact with and (suspected) exposure to covid-19: Secondary | ICD-10-CM | POA: Diagnosis present

## 2021-09-26 DIAGNOSIS — Z809 Family history of malignant neoplasm, unspecified: Secondary | ICD-10-CM

## 2021-09-26 DIAGNOSIS — Z6839 Body mass index (BMI) 39.0-39.9, adult: Secondary | ICD-10-CM

## 2021-09-26 DIAGNOSIS — Z8249 Family history of ischemic heart disease and other diseases of the circulatory system: Secondary | ICD-10-CM

## 2021-09-26 DIAGNOSIS — K5732 Diverticulitis of large intestine without perforation or abscess without bleeding: Secondary | ICD-10-CM | POA: Diagnosis present

## 2021-09-26 DIAGNOSIS — E669 Obesity, unspecified: Secondary | ICD-10-CM | POA: Diagnosis present

## 2021-09-26 DIAGNOSIS — E860 Dehydration: Secondary | ICD-10-CM | POA: Diagnosis present

## 2021-09-26 LAB — CBC WITH DIFFERENTIAL/PLATELET
Abs Immature Granulocytes: 0.1 10*3/uL — ABNORMAL HIGH (ref 0.00–0.07)
Basophils Absolute: 0.1 10*3/uL (ref 0.0–0.1)
Basophils Relative: 0 %
Eosinophils Absolute: 0 10*3/uL (ref 0.0–0.5)
Eosinophils Relative: 0 %
HCT: 39.8 % (ref 39.0–52.0)
Hemoglobin: 13.4 g/dL (ref 13.0–17.0)
Immature Granulocytes: 1 %
Lymphocytes Relative: 3 %
Lymphs Abs: 0.4 10*3/uL — ABNORMAL LOW (ref 0.7–4.0)
MCH: 31 pg (ref 26.0–34.0)
MCHC: 33.7 g/dL (ref 30.0–36.0)
MCV: 92.1 fL (ref 80.0–100.0)
Monocytes Absolute: 0.6 10*3/uL (ref 0.1–1.0)
Monocytes Relative: 4 %
Neutro Abs: 12.7 10*3/uL — ABNORMAL HIGH (ref 1.7–7.7)
Neutrophils Relative %: 92 %
Platelets: 304 10*3/uL (ref 150–400)
RBC: 4.32 MIL/uL (ref 4.22–5.81)
RDW: 14.9 % (ref 11.5–15.5)
WBC: 13.8 10*3/uL — ABNORMAL HIGH (ref 4.0–10.5)
nRBC: 0 % (ref 0.0–0.2)

## 2021-09-26 LAB — COMPREHENSIVE METABOLIC PANEL
ALT: 24 U/L (ref 0–44)
AST: 22 U/L (ref 15–41)
Albumin: 3.9 g/dL (ref 3.5–5.0)
Alkaline Phosphatase: 63 U/L (ref 38–126)
Anion gap: 10 (ref 5–15)
BUN: 16 mg/dL (ref 6–20)
CO2: 25 mmol/L (ref 22–32)
Calcium: 8.6 mg/dL — ABNORMAL LOW (ref 8.9–10.3)
Chloride: 106 mmol/L (ref 98–111)
Creatinine, Ser: 1.38 mg/dL — ABNORMAL HIGH (ref 0.61–1.24)
GFR, Estimated: >60 mL/min (ref >60)
Glucose, Bld: 154 mg/dL — ABNORMAL HIGH (ref 70–99)
Potassium: 3.7 mmol/L (ref 3.5–5.1)
Sodium: 141 mmol/L (ref 135–145)
Total Bilirubin: 0.8 mg/dL (ref 0.3–1.2)
Total Protein: 7.2 g/dL (ref 6.5–8.1)

## 2021-09-26 LAB — URINALYSIS, ROUTINE W REFLEX MICROSCOPIC
Bacteria, UA: NONE SEEN
Bilirubin Urine: NEGATIVE
Glucose, UA: NEGATIVE mg/dL
Hgb urine dipstick: NEGATIVE
Ketones, ur: NEGATIVE mg/dL
Leukocytes,Ua: NEGATIVE
Nitrite: NEGATIVE
Protein, ur: 30 mg/dL — AB
Specific Gravity, Urine: 1.024 (ref 1.005–1.030)
pH: 7 (ref 5.0–8.0)

## 2021-09-26 LAB — LIPASE, BLOOD: Lipase: 27 U/L (ref 11–51)

## 2021-09-26 MED ORDER — ONDANSETRON 8 MG PO TBDP: 8.0000 mg | ORAL_TABLET | Freq: Once | ORAL | Status: DC

## 2021-09-26 MED ORDER — IOHEXOL 300 MG/ML  SOLN
100.0000 mL | Freq: Once | INTRAMUSCULAR | Status: AC | PRN
  Administered 2021-09-26: 100 mL via INTRAVENOUS

## 2021-09-26 MED ORDER — ACETAMINOPHEN 325 MG PO TABS
650.0000 mg | ORAL_TABLET | Freq: Once | ORAL | Status: DC
  Filled 2021-09-26: qty 2

## 2021-09-26 MED ORDER — SODIUM CHLORIDE 0.9 % IV BOLUS
1000.0000 mL | Freq: Once | INTRAVENOUS | Status: AC
  Administered 2021-09-26: 1000 mL via INTRAVENOUS

## 2021-09-26 MED ORDER — ONDANSETRON HCL 4 MG/2ML IJ SOLN
4.0000 mg | Freq: Once | INTRAMUSCULAR | Status: AC
  Administered 2021-09-26: 4 mg via INTRAVENOUS
  Filled 2021-09-26: qty 2

## 2021-09-26 MED ORDER — METRONIDAZOLE 500 MG/100ML IV SOLN
500.0000 mg | Freq: Once | INTRAVENOUS | Status: AC
  Administered 2021-09-27: 500 mg via INTRAVENOUS
  Filled 2021-09-26: qty 100

## 2021-09-26 MED ORDER — SODIUM CHLORIDE 0.9 % IV SOLN
2.0000 g | Freq: Once | INTRAVENOUS | Status: AC
  Administered 2021-09-27: 2 g via INTRAVENOUS
  Filled 2021-09-26: qty 20

## 2021-09-26 MED ORDER — SODIUM CHLORIDE (PF) 0.9 % IJ SOLN
INTRAMUSCULAR | Status: AC
  Filled 2021-09-26: qty 50

## 2021-09-26 NOTE — ED Provider Notes (Signed)
[facility]-EMERGENCY DEPT Provider Note   CSN: [id number] Arrival date & time: 09/26/21  2037     History  Chief Complaint  Patient presents with   Vomiting    Levi Powell is a 36 y.o. male.  HPI  36 year old male presents emergency department with complaints of abdominal pain and vomiting.  Mother is with patient who is primary story and.  She states that symptoms began earlier today after eating lunch which consisted of pork.  Associated diarrhea as well.  He has been persistently vomiting since symptom onset.  Abdominal pain is described as left-sided in nature.  Mother also notes patient has been febrile as of today.  Denies hematic emesis, chest pain, shortness of breath, urinary symptoms, hematochezia, melena.  Past medical history significant for Down syndrome, GERD, intractable vomiting  Home Medications Prior to Admission medications   Medication Sig Start Date End Date Taking? Authorizing Provider  diclofenac Sodium (VOLTAREN) 1 % GEL Apply 4 g topically 4 (four) times daily as needed. 09/03/20   Hilts, Casimiro Needle, MD  ibuprofen (ADVIL) 800 MG tablet Take 800 mg by mouth every 8 (eight) hours as needed.    [provider]  omeprazole (PRILOSEC) 20 MG capsule Take 20 mg by mouth daily. 06/01/16   [provider]      Allergies    Patient has no known allergies.    Review of Systems   Review of Systems  All other systems reviewed and are negative.   Physical Exam Updated Vital Signs BP 118/64   Pulse 88   Temp (!) 100.8 F (38.2 C) (Oral)   Resp 18   Ht 5' (1.524 m)   Wt 86.2 kg   SpO2 97%   BMI 37.11 kg/m  Physical Exam Vitals and nursing note reviewed.  Constitutional:      General: He is not in acute distress.    Appearance: He is well-developed. He is obese.  HENT:     Head: Normocephalic and atraumatic.  Eyes:     Conjunctiva/sclera: Conjunctivae normal.  Cardiovascular:     Rate and Rhythm: Normal rate  and regular rhythm.     Heart sounds: No murmur heard. Pulmonary:     Effort: Pulmonary effort is normal. No respiratory distress.     Breath sounds: Normal breath sounds.  Abdominal:     Palpations: Abdomen is soft.     Tenderness: There is abdominal tenderness in the left upper quadrant and left lower quadrant. There is no right CVA tenderness or left CVA tenderness.     Comments: No overlying skin abnormalities noted.  Musculoskeletal:        General: No swelling.     Cervical back: Neck supple.  Skin:    General: Skin is warm and dry.     Capillary Refill: Capillary refill takes less than 2 seconds.  Neurological:     Mental Status: He is alert.  Psychiatric:        Mood and Affect: Mood normal.     ED Results / Procedures / Treatments   Labs (all labs ordered are listed, but only abnormal results are displayed) Labs Reviewed  CBC WITH DIFFERENTIAL/PLATELET - Abnormal; Notable for the following components:      Result Value   WBC 13.8 (*)    Neutro Abs 12.7 (*)    Lymphs Abs 0.4 (*)    Abs Immature Granulocytes 0.10 (*)    All other components within normal limits  COMPREHENSIVE METABOLIC  PANEL - Abnormal; Notable for the following components:   Glucose, Bld 154 (*)    Creatinine, Ser 1.38 (*)    Calcium 8.6 (*)    All other components within normal limits  URINALYSIS, ROUTINE W REFLEX MICROSCOPIC - Abnormal; Notable for the following components:   APPearance HAZY (*)    Protein, ur 30 (*)    All other components within normal limits  RESP PANEL BY RT-PCR (FLU A&B, COVID) ARPGX2  CULTURE, BLOOD (ROUTINE X 2)  CULTURE, BLOOD (ROUTINE X 2)  LIPASE, BLOOD  LACTIC ACID, PLASMA  LACTIC ACID, PLASMA    EKG None  Radiology CT Abdomen Pelvis W Contrast  Result Date: 09/26/2021 CLINICAL DATA:  Abdominal pain EXAM: CT ABDOMEN AND PELVIS WITH CONTRAST TECHNIQUE: Multidetector CT imaging of the abdomen and pelvis was performed using the standard protocol following  bolus administration of intravenous contrast. RADIATION DOSE REDUCTION: This exam was performed according to the departmental dose-optimization program which includes automated exposure control, adjustment of the mA and/or kV according to patient size and/or use of iterative reconstruction technique. CONTRAST:  OMNIPAQUE IOHEXOL 300 MG/ML  SOLN COMPARISON:  CT examination dated November 30, 2016. FINDINGS: Lower chest: Trace bilateral pleural effusions. Hepatobiliary: No focal liver abnormality is seen. No gallstones, gallbladder wall thickening, or biliary dilatation. Pancreas: Unremarkable. No pancreatic ductal dilatation or surrounding inflammatory changes. Spleen: Normal in size without focal abnormality. Adrenals/Urinary Tract: Adrenal glands are unremarkable. Kidneys are normal, without renal calculi, focal lesion, or hydronephrosis. Bladder is unremarkable. Stomach/Bowel: Stomach is within normal limits. Appendix appears normal. Sigmoid colonic diverticulosis with focal wall thickening and adjacent fat stranding in the left lower quadrant concerning for acute diverticulitis (series 2 image 66-74; series 5 image 61-64). No adjacent fluid collection or abscess. 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 Abdominal aorta and branch vessels are normal in caliber. Reproductive: Prostate is unremarkable. Other: Fat containing umbilical hernia.  No abdominopelvic ascites. Musculoskeletal: Mild degenerate disc disease of the lumbar spine. No acute osseous abnormality. 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 IMPRESSION: 1. Sigmoid colonic diverticulosis with focal wall thickening and adjacent fat stranding suggesting acute sigmoid colonic diverticulitis. No adjacent fluid collection or abscess. 2.  No evidence of nephrolithiasis or hydronephrosis. 3.  Additional chronic findings as above. Electronically Signed   By: Larose Hires D.O.    On: 09/26/2021 23:17   DG Chest 2 View  Result Date: 09/26/2021 CLINICAL DATA:  Cough, vomiting, diarrhea EXAM: CHEST - 2 VIEW COMPARISON:  04/05/2017 FINDINGS: Low lung volumes. No focal consolidation. No pleural effusion or pneumothorax. The heart is top-normal in size for inspiration. Degenerative changes of the visualized thoracolumbar spine. IMPRESSION: Normal chest radiographs. Electronically Signed   By: Charline Bills M.D.   On: 09/26/2021 22:14    Procedures .Critical Care  Performed by: Peter Garter, PA Authorized by: Peter Garter, PA   Critical care provider statement:    Critical care time (minutes):  35   Critical care was necessary to treat or prevent imminent or life-threatening deterioration of the following conditions:  Sepsis   Critical care was time spent personally by me on the following activities:  Development of treatment plan with patient or surrogate, discussions with consultants, evaluation of patient's response to treatment, examination of patient, ordering and performing treatments and interventions, ordering and review of laboratory studies, pulse oximetry, ordering and review of radiographic studies, re-evaluation of patient's condition and review of  old charts   I assumed direction of critical care for this patient from another provider in my specialty: no     Care discussed with: admitting provider       Medications Ordered in ED Medications  sodium chloride (PF) 0.9 % injection (has no administration in time range)  acetaminophen (TYLENOL) tablet 650 mg (has no administration in time range)  cefTRIAXone (ROCEPHIN) 2 g in sodium chloride 0.9 % 100 mL IVPB (has no administration in time range)    And  metroNIDAZOLE (FLAGYL) IVPB 500 mg (has no administration in time range)  sodium chloride 0.9 % bolus 1,000 mL (1,000 mLs Intravenous New Bag/Given 09/26/21 2147)  ondansetron (ZOFRAN) injection 4 mg (4 mg Intravenous Given 09/26/21 2143)  iohexol  (OMNIPAQUE) 300 MG/ML solution 100 mL (100 mLs Intravenous Contrast Given 09/26/21 2255)    ED Course/ Medical Decision Making/ A&P                           Medical Decision Making Amount and/or Complexity of Data Reviewed Labs: ordered. Radiology: ordered.  Risk OTC drugs. Prescription drug management.   This patient presents to the ED for concern of abdominal pain, this involves an extensive number of treatment options, and is a complaint that carries with it a high risk of complications and morbidity.  The differential diagnosis includes The causes of generalized abdominal pain include but are not limited to AAA, mesenteric ischemia, appendicitis, diverticulitis, DKA, gastritis, gastroenteritis, AMI, nephrolithiasis, pancreatitis, peritonitis, adrenal insufficiency,lead poisoning, iron toxicity, intestinal ischemia, constipation, UTI,SBO/LBO, splenic rupture, biliary disease, IBD, IBS, PUD, or hepatitis.  Co morbidities that complicate the patient evaluation  See HPI   Additional history obtained:  Additional history obtained from mother who is at bedside External records from outside source obtained and reviewed including CT on pelvis from 11/30/2016   Lab Tests:  I Ordered, and personally interpreted labs.  The pertinent results include: Leukocytosis of 13.8 with noted left shift.  Creatinine 1.38, 30 protein urinalysis.   Imaging Studies ordered:  I ordered imaging studies including chest x-ray and CT abdomen pelvis I independently visualized and interpreted imaging which showed  Chest x-ray: No acute abnormality CT abdomen pelvis: Sigmoid colonic diverticulosis with focal wall thickening and adjacent fat stranding suggesting acute sigmoid colonic diverticulitis.  No adjacent fluid collection or abscess.  No evidence of nephrolithiasis or hydronephrosis. I agree with the radiologist interpretation  Cardiac Monitoring: / EKG:  The patient was maintained on a cardiac  monitor.  I personally viewed and interpreted the cardiac monitored which showed an underlying rhythm of: Sinus rhythm   Consultations Obtained:  I requested consultation with the hospital doctor Howerter, and discussed lab and imaging findings as well as pertinent plan - they recommend: He agreed with admission and assuming further treatment/care of patient.   Problem List / ED Course / Critical interventions / Medication management  Sigmoid diverticulitis I ordered medication including Zofran for nausea/vomiting, Rocephin and Flagyl for antibiotic coverage, Tylenol for fever  Reevaluation of the patient after these medicines showed that the patient improved I have reviewed the patients home medicines and have made adjustments as needed   Social Determinants of Health:  Patient has Down syndrome and is dependent on parents.  Test / Admission - Considered:  Diverticulitis Vitals signs significant for Tachycardia initially with a rate of 110.  This responded well to IV fluids as well as antiemetic.  Patient's been febrile throughout visit with  initial temp of 100.8. Otherwise within normal range and stable throughout visit. Laboratory/imaging studies significant for: See above Patient meets sepsis criteria and blood benefit from admission.  IV antibiotics began while in the emergency department.  Hospital medicine was consulted regarding patient agreed with admission.  Patient and family were updated regarding treatment plan and they are interested in understanding agreeable to said plan.  Patient was stable upon admission.        Final Clinical Impression(s) / ED Diagnoses Final diagnoses:  Sepsis, due to unspecified organism, unspecified whether acute organ dysfunction present [facility])  Diverticulitis    Rx / DC Orders ED Discharge Orders     None         Peter Garter, PA 09/27/21 0020    Melene Plan, DO 10/04/21 [phone]

## 2021-09-26 NOTE — ED Provider Notes (Incomplete)
[facility]-EMERGENCY DEPT Provider Note   CSN: [id number] Arrival date & time: 09/26/21  2037     History {Add pertinent medical, surgical, social history, OB history to HPI:1} Chief Complaint  Patient presents with  . Vomiting    ZACKARIAH VANDERPOL is a 36 y.o. male.  HPI  36 year old male presents emergency department with complaints of abdominal pain and vomiting.  Mother is with patient who is primary story and.  She states that symptoms began earlier today after eating lunch which consisted of pork.  Associated diarrhea as well.  He has been persistently vomiting since symptom onset.  Abdominal pain is described as left-sided in nature.  Mother also notes patient has been febrile as of today.  Denies hematic emesis, chest pain, shortness of breath, urinary symptoms, hematochezia, melena.  Past medical history significant for Down syndrome, GERD, intractable vomiting  Home Medications Prior to Admission medications   Medication Sig Start Date End Date Taking? Authorizing Provider  diclofenac Sodium (VOLTAREN) 1 % GEL Apply 4 g topically 4 (four) times daily as needed. 09/03/20   Hilts, Casimiro Needle, MD  ibuprofen (ADVIL) 800 MG tablet Take 800 mg by mouth every 8 (eight) hours as needed.    [provider]  omeprazole (PRILOSEC) 20 MG capsule Take 20 mg by mouth daily. 06/01/16   [provider]      Allergies    Patient has no known allergies.    Review of Systems   Review of Systems  All other systems reviewed and are negative.   Physical Exam Updated Vital Signs BP 118/64   Pulse 88   Temp (!) 100.8 F (38.2 C) (Oral)   Resp 18   Ht 5' (1.524 m)   Wt 86.2 kg   SpO2 97%   BMI 37.11 kg/m  Physical Exam Vitals and nursing note reviewed.  Constitutional:      General: He is not in acute distress.    Appearance: He is well-developed. He is obese.  HENT:     Head: Normocephalic and atraumatic.  Eyes:     Conjunctiva/sclera:  Conjunctivae normal.  Cardiovascular:     Rate and Rhythm: Normal rate and regular rhythm.     Heart sounds: No murmur heard. Pulmonary:     Effort: Pulmonary effort is normal. No respiratory distress.     Breath sounds: Normal breath sounds.  Abdominal:     Palpations: Abdomen is soft.     Tenderness: There is abdominal tenderness in the left upper quadrant and left lower quadrant. There is no right CVA tenderness or left CVA tenderness.     Comments: No overlying skin abnormalities noted.  Musculoskeletal:        General: No swelling.     Cervical back: Neck supple.  Skin:    General: Skin is warm and dry.     Capillary Refill: Capillary refill takes less than 2 seconds.  Neurological:     Mental Status: He is alert.  Psychiatric:        Mood and Affect: Mood normal.     ED Results / Procedures / Treatments   Labs (all labs ordered are listed, but only abnormal results are displayed) Labs Reviewed  CBC WITH DIFFERENTIAL/PLATELET - Abnormal; Notable for the following components:      Result Value   WBC 13.8 (*)    Neutro Abs 12.7 (*)    Lymphs Abs 0.4 (*)    Abs Immature Granulocytes 0.10 (*)  All other components within normal limits  COMPREHENSIVE METABOLIC PANEL - Abnormal; Notable for the following components:   Glucose, Bld 154 (*)    Creatinine, Ser 1.38 (*)    Calcium 8.6 (*)    All other components within normal limits  URINALYSIS, ROUTINE W REFLEX MICROSCOPIC - Abnormal; Notable for the following components:   APPearance HAZY (*)    Protein, ur 30 (*)    All other components within normal limits  RESP PANEL BY RT-PCR (FLU A&B, COVID) ARPGX2  CULTURE, BLOOD (ROUTINE X 2)  CULTURE, BLOOD (ROUTINE X 2)  LIPASE, BLOOD  LACTIC ACID, PLASMA  LACTIC ACID, PLASMA    EKG None  Radiology CT Abdomen Pelvis W Contrast  Result Date: 09/26/2021 CLINICAL DATA:  Abdominal pain EXAM: CT ABDOMEN AND PELVIS WITH CONTRAST TECHNIQUE: Multidetector CT imaging of the  abdomen and pelvis was performed using the standard protocol following bolus administration of intravenous contrast. RADIATION DOSE REDUCTION: This exam was performed according to the departmental dose-optimization program which includes automated exposure control, adjustment of the mA and/or kV according to patient size and/or use of iterative reconstruction technique. CONTRAST:  OMNIPAQUE IOHEXOL 300 MG/ML  SOLN COMPARISON:  CT examination dated November 30, 2016. FINDINGS: Lower chest: Trace bilateral pleural effusions. Hepatobiliary: No focal liver abnormality is seen. No gallstones, gallbladder wall thickening, or biliary dilatation. Pancreas: Unremarkable. No pancreatic ductal dilatation or surrounding inflammatory changes. Spleen: Normal in size without focal abnormality. Adrenals/Urinary Tract: Adrenal glands are unremarkable. Kidneys are normal, without renal calculi, focal lesion, or hydronephrosis. Bladder is unremarkable. Stomach/Bowel: Stomach is within normal limits. Appendix appears normal. Sigmoid colonic diverticulosis with focal wall thickening and adjacent fat stranding in the left lower quadrant concerning for acute diverticulitis (series 2 image 66-74; series 5 image 61-64). No adjacent fluid collection or abscess. 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 Abdominal aorta and branch vessels are normal in caliber. Reproductive: Prostate is unremarkable. Other: Fat containing umbilical hernia.  No abdominopelvic ascites. Musculoskeletal: Mild degenerate disc disease of the lumbar spine. No acute osseous abnormality. 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 IMPRESSION: 1. Sigmoid colonic diverticulosis with focal wall thickening and adjacent fat stranding suggesting acute sigmoid colonic diverticulitis. No adjacent fluid collection or abscess. 2.  No evidence of nephrolithiasis or hydronephrosis. 3.  Additional  chronic findings as above. Electronically Signed   By: Larose Hires D.O.   On: 09/26/2021 23:17   DG Chest 2 View  Result Date: 09/26/2021 CLINICAL DATA:  Cough, vomiting, diarrhea EXAM: CHEST - 2 VIEW COMPARISON:  04/05/2017 FINDINGS: Low lung volumes. No focal consolidation. No pleural effusion or pneumothorax. The heart is top-normal in size for inspiration. Degenerative changes of the visualized thoracolumbar spine. IMPRESSION: Normal chest radiographs. Electronically Signed   By: Charline Bills M.D.   On: 09/26/2021 22:14    Procedures .Critical Care  Performed by: Peter Garter, PA Authorized by: Peter Garter, PA   Critical care provider statement:    Critical care time (minutes):  35   Critical care was necessary to treat or prevent imminent or life-threatening deterioration of the following conditions:  Sepsis   Critical care was time spent personally by me on the following activities:  Development of treatment plan with patient or surrogate, discussions with consultants, evaluation of patient's response to treatment, examination of patient, ordering and performing treatments and interventions, ordering and review of laboratory studies, pulse oximetry, ordering and review of  radiographic studies, re-evaluation of patient's condition and review of old charts   I assumed direction of critical care for this patient from another provider in my specialty: no     Care discussed with: admitting provider     {Document cardiac monitor, telemetry assessment procedure when appropriate:1}  Medications Ordered in ED Medications  sodium chloride (PF) 0.9 % injection (has no administration in time range)  acetaminophen (TYLENOL) tablet 650 mg (has no administration in time range)  cefTRIAXone (ROCEPHIN) 2 g in sodium chloride 0.9 % 100 mL IVPB (has no administration in time range)    And  metroNIDAZOLE (FLAGYL) IVPB 500 mg (has no administration in time range)  sodium chloride 0.9 %  bolus 1,000 mL (1,000 mLs Intravenous New Bag/Given 09/26/21 2147)  ondansetron (ZOFRAN) injection 4 mg (4 mg Intravenous Given 09/26/21 2143)  iohexol (OMNIPAQUE) 300 MG/ML solution 100 mL (100 mLs Intravenous Contrast Given 09/26/21 2255)    ED Course/ Medical Decision Making/ A&P                           Medical Decision Making Amount and/or Complexity of Data Reviewed Labs: ordered. Radiology: ordered.  Risk OTC drugs. Prescription drug management.   This patient presents to the ED for concern of abdominal pain, this involves an extensive number of treatment options, and is a complaint that carries with it a high risk of complications and morbidity.  The differential diagnosis includes The causes of generalized abdominal pain include but are not limited to AAA, mesenteric ischemia, appendicitis, diverticulitis, DKA, gastritis, gastroenteritis, AMI, nephrolithiasis, pancreatitis, peritonitis, adrenal insufficiency,lead poisoning, iron toxicity, intestinal ischemia, constipation, UTI,SBO/LBO, splenic rupture, biliary disease, IBD, IBS, PUD, or hepatitis.  Co morbidities that complicate the patient evaluation  See HPI   Additional history obtained:  Additional history obtained from mother who is at bedside External records from outside source obtained and reviewed including CT on pelvis from 11/30/2016   Lab Tests:  I Ordered, and personally interpreted labs.  The pertinent results include: Leukocytosis of 13.8 with noted left shift.  Creatinine 1.38, 30 protein urinalysis.   Imaging Studies ordered:  I ordered imaging studies including chest x-ray and CT abdomen pelvis I independently visualized and interpreted imaging which showed  Chest x-ray: No acute abnormality CT abdomen pelvis: Sigmoid colonic diverticulosis with focal wall thickening and adjacent fat stranding suggesting acute sigmoid colonic diverticulitis.  No adjacent fluid collection or abscess.  No evidence of  nephrolithiasis or hydronephrosis. I agree with the radiologist interpretation  Cardiac Monitoring: / EKG:  The patient was maintained on a cardiac monitor.  I personally viewed and interpreted the cardiac monitored which showed an underlying rhythm of: Sinus rhythm   Consultations Obtained:  I requested consultation with the ***,  and discussed lab and imaging findings as well as pertinent plan - they recommend: ***   Problem List / ED Course / Critical interventions / Medication management  Sigmoid diverticulitis I ordered medication including Zofran for nausea/vomiting, Rocephin and Flagyl for antibiotic coverage, Tylenol for fever  Reevaluation of the patient after these medicines showed that the patient improved I have reviewed the patients home medicines and have made adjustments as needed   Social Determinants of Health:  Patient has Down syndrome and is dependent on parents.  Test / Admission - Considered:  Diverticulitis Vitals signs significant for Tachycardia initially with a rate of 110.  This responded well to IV fluids as well as antiemetic.  Patient's been febrile throughout visit with initial temp of 100.8. Otherwise within normal range and stable throughout visit. Laboratory/imaging studies significant for: See above Patient meets sepsis criteria and blood benefit from admission.  IV antibiotics began while in the emergency department.  Hospital medicine was consulted regarding patient agreed with admission.  Patient and family were updated regarding treatment plan and they are interested in understanding agreeable to said plan.  Patient was stable upon admission.  {Document critical care time when appropriate:1} {Document review of labs and clinical decision tools ie heart score, Chads2Vasc2 etc:1}  {Document your independent review of radiology images, and any outside records:1} {Document your discussion with family members, caretakers, and with  consultants:1} {Document social determinants of health affecting pt's care:1} {Document your decision making why or why not admission, treatments were needed:1} Final Clinical Impression(s) / ED Diagnoses Final diagnoses:  Sepsis, due to unspecified organism, unspecified whether acute organ dysfunction present [facility])  Diverticulitis    Rx / DC Orders ED Discharge Orders     None

## 2021-09-26 NOTE — ED Triage Notes (Signed)
Pt reports vomiting and diarrhea starting today. Reports some lower abdominal pain. Vomited lunch and could not eat dinner.

## 2021-09-27 ENCOUNTER — Other Ambulatory Visit: Payer: Self-pay

## 2021-09-27 ENCOUNTER — Encounter (HOSPITAL_COMMUNITY): Payer: Self-pay | Admitting: Internal Medicine

## 2021-09-27 DIAGNOSIS — E876 Hypokalemia: Secondary | ICD-10-CM | POA: Diagnosis not present

## 2021-09-27 DIAGNOSIS — Z809 Family history of malignant neoplasm, unspecified: Secondary | ICD-10-CM | POA: Diagnosis not present

## 2021-09-27 DIAGNOSIS — Z20822 Contact with and (suspected) exposure to covid-19: Secondary | ICD-10-CM | POA: Diagnosis present

## 2021-09-27 DIAGNOSIS — A419 Sepsis, unspecified organism: Secondary | ICD-10-CM

## 2021-09-27 DIAGNOSIS — Q909 Down syndrome, unspecified: Secondary | ICD-10-CM | POA: Diagnosis not present

## 2021-09-27 DIAGNOSIS — K219 Gastro-esophageal reflux disease without esophagitis: Secondary | ICD-10-CM | POA: Diagnosis present

## 2021-09-27 DIAGNOSIS — R6520 Severe sepsis without septic shock: Secondary | ICD-10-CM | POA: Diagnosis present

## 2021-09-27 DIAGNOSIS — K5732 Diverticulitis of large intestine without perforation or abscess without bleeding: Secondary | ICD-10-CM | POA: Diagnosis present

## 2021-09-27 DIAGNOSIS — E669 Obesity, unspecified: Secondary | ICD-10-CM | POA: Diagnosis present

## 2021-09-27 DIAGNOSIS — Z8249 Family history of ischemic heart disease and other diseases of the circulatory system: Secondary | ICD-10-CM | POA: Diagnosis not present

## 2021-09-27 DIAGNOSIS — Z6839 Body mass index (BMI) 39.0-39.9, adult: Secondary | ICD-10-CM | POA: Diagnosis not present

## 2021-09-27 DIAGNOSIS — E860 Dehydration: Secondary | ICD-10-CM | POA: Diagnosis present

## 2021-09-27 DIAGNOSIS — K5792 Diverticulitis of intestine, part unspecified, without perforation or abscess without bleeding: Secondary | ICD-10-CM | POA: Diagnosis present

## 2021-09-27 LAB — COMPREHENSIVE METABOLIC PANEL
ALT: 21 U/L (ref 0–44)
AST: 17 U/L (ref 15–41)
Albumin: 3.3 g/dL — ABNORMAL LOW (ref 3.5–5.0)
Alkaline Phosphatase: 53 U/L (ref 38–126)
Anion gap: 9 (ref 5–15)
BUN: 14 mg/dL (ref 6–20)
CO2: 25 mmol/L (ref 22–32)
Calcium: 7.9 mg/dL — ABNORMAL LOW (ref 8.9–10.3)
Chloride: 108 mmol/L (ref 98–111)
Creatinine, Ser: 1.33 mg/dL — ABNORMAL HIGH (ref 0.61–1.24)
GFR, Estimated: >60 mL/min (ref >60)
Glucose, Bld: 113 mg/dL — ABNORMAL HIGH (ref 70–99)
Potassium: 4 mmol/L (ref 3.5–5.1)
Sodium: 142 mmol/L (ref 135–145)
Total Bilirubin: 0.5 mg/dL (ref 0.3–1.2)
Total Protein: 6.3 g/dL — ABNORMAL LOW (ref 6.5–8.1)

## 2021-09-27 LAB — CBC WITH DIFFERENTIAL/PLATELET
Abs Immature Granulocytes: 0.06 10*3/uL (ref 0.00–0.07)
Basophils Absolute: 0.1 10*3/uL (ref 0.0–0.1)
Basophils Relative: 1 %
Eosinophils Absolute: 0 10*3/uL (ref 0.0–0.5)
Eosinophils Relative: 0 %
HCT: 37.1 % — ABNORMAL LOW (ref 39.0–52.0)
Hemoglobin: 12.3 g/dL — ABNORMAL LOW (ref 13.0–17.0)
Immature Granulocytes: 1 %
Lymphocytes Relative: 5 %
Lymphs Abs: 0.5 10*3/uL — ABNORMAL LOW (ref 0.7–4.0)
MCH: 31.2 pg (ref 26.0–34.0)
MCHC: 33.2 g/dL (ref 30.0–36.0)
MCV: 94.2 fL (ref 80.0–100.0)
Monocytes Absolute: 0.3 10*3/uL (ref 0.1–1.0)
Monocytes Relative: 3 %
Neutro Abs: 9.8 10*3/uL — ABNORMAL HIGH (ref 1.7–7.7)
Neutrophils Relative %: 90 %
Platelets: 256 10*3/uL (ref 150–400)
RBC: 3.94 MIL/uL — ABNORMAL LOW (ref 4.22–5.81)
RDW: 15.3 % (ref 11.5–15.5)
WBC: 10.7 10*3/uL — ABNORMAL HIGH (ref 4.0–10.5)
nRBC: 0 % (ref 0.0–0.2)

## 2021-09-27 LAB — MAGNESIUM
Magnesium: 1.9 mg/dL (ref 1.7–2.4)
Magnesium: 2 mg/dL (ref 1.7–2.4)

## 2021-09-27 LAB — RESP PANEL BY RT-PCR (FLU A&B, COVID) ARPGX2
Influenza A by PCR: NEGATIVE
Influenza B by PCR: NEGATIVE
SARS Coronavirus 2 by RT PCR: NEGATIVE

## 2021-09-27 LAB — LACTIC ACID, PLASMA
Lactic Acid, Venous: 2.7 mmol/L (ref 0.5–1.9)
Lactic Acid, Venous: 2.2 mmol/L (ref 0.5–1.9)
Lactic Acid, Venous: 1.6 mmol/L (ref 0.5–1.9)

## 2021-09-27 MED ORDER — SODIUM CHLORIDE 0.9 % IV SOLN
2.0000 g | INTRAVENOUS | Status: DC
  Administered 2021-09-27 – 2021-09-28 (×2): 2 g via INTRAVENOUS
  Filled 2021-09-27 (×2): qty 20

## 2021-09-27 MED ORDER — ACETAMINOPHEN 160 MG/5ML PO SOLN
650.0000 mg | Freq: Four times a day (QID) | ORAL | Status: DC | PRN
  Administered 2021-09-27: 650 mg via ORAL
  Filled 2021-09-27: qty 20.3

## 2021-09-27 MED ORDER — HYDROMORPHONE HCL 1 MG/ML IJ SOLN
0.5000 mg | INTRAMUSCULAR | Status: DC | PRN
  Administered 2021-09-27: 0.5 mg via INTRAVENOUS
  Filled 2021-09-27: qty 1

## 2021-09-27 MED ORDER — ACETAMINOPHEN 325 MG PO TABS: 650.0000 mg | ORAL_TABLET | Freq: Four times a day (QID) | ORAL | Status: DC | PRN

## 2021-09-27 MED ORDER — ONDANSETRON HCL 4 MG/2ML IJ SOLN: 4.0000 mg | Freq: Four times a day (QID) | INTRAMUSCULAR | Status: DC | PRN

## 2021-09-27 MED ORDER — NALOXONE HCL 0.4 MG/ML IJ SOLN: 0.4000 mg | INTRAMUSCULAR | Status: DC | PRN

## 2021-09-27 MED ORDER — METRONIDAZOLE 500 MG/100ML IV SOLN
500.0000 mg | Freq: Two times a day (BID) | INTRAVENOUS | Status: DC
  Administered 2021-09-27 – 2021-09-28 (×4): 500 mg via INTRAVENOUS
  Filled 2021-09-27 (×4): qty 100

## 2021-09-27 MED ORDER — PANTOPRAZOLE SODIUM 40 MG IV SOLR
40.0000 mg | INTRAVENOUS | Status: DC
  Administered 2021-09-27 – 2021-09-29 (×3): 40 mg via INTRAVENOUS
  Filled 2021-09-27 (×3): qty 10

## 2021-09-27 MED ORDER — ACETAMINOPHEN 650 MG RE SUPP: 650.0000 mg | Freq: Four times a day (QID) | RECTAL | Status: DC | PRN

## 2021-09-27 MED ORDER — LACTATED RINGERS IV SOLN: INTRAVENOUS | Status: DC

## 2021-09-27 NOTE — Progress Notes (Signed)
Patient admitted after midnight, please see H&P.  Here with diverticulitis.  Currently resting. Continue IV abx, IVF and pain control.  Will d/c tele and change to med surge bed. Marlin Canary DO

## 2021-09-27 NOTE — H&P (Signed)
History and Physical    PLEASE NOTE THAT DRAGON DICTATION SOFTWARE WAS USED IN THE CONSTRUCTION OF THIS NOTE.   Levi Powell FAO:[id number] DOB: November 10, 1985 DOA: 09/26/2021  PCP: Maryland Pink, MD  Patient coming from: home   I have personally briefly reviewed patient's old medical records in Levi Powell  Chief Complaint: Abdominal pain  HPI: Levi Powell is a 36 y.o. male with medical history significant for Down syndrome, GERD, who is admitted to [facility] on 09/26/2021 with severe sepsis due to acute sigmoid colonic diverticulitis after presenting from home to [facility] ED complaining of abdominal pain.   The following history is provided by the patient as well as my discussions with the patient's mother, who is present at bedside, in addition to my discussions with the EDP and via chart review.  The patient has been experiencing 2 days of progressive left lower quadrant abdominal discomfort, which has been constant over that timeframe, worse with palpation over the abdomen.  Associated with intermittent nausea resulting in at least 3-4 episodes of nonbloody, nonbilious emesis over that timeframe.  Consequently, Levi Powell has been declining oral intake of both food and water for the last 1 to 2 days.  Sent 1-2 loose stools over the last day, in the absence of any associated melena or hematochezia.  This is also been associated with subjective fever at home.   Not associate with any shortness of breath, cough, rash, dysuria.  No chest pain.      ED Course:  Vital signs in the ED were notable for the following: Temperature max 100.8; initial heart rate 110, which decreased to 94 following interval initiation of IV fluids; blood pressure 127/63; respiratory rate 18, oxygen saturation 96 to 98% on room air.  Labs were notable for the following: CMP notable for the following: Creatinine 1.38 compared to 1.39 in February 2019, liver enzymes within normal limits.  Lipase  27.  Initial lactate 2.7, with repeat value trending down to 2.2.  CBC notable for white blood cell count 13,800 with 90% neutrophils.  Urinalysis showed no white blood cells, no bacteria, nitrite/leukocyte esterase negative, and was notable for specific gravity 1.024.  Blood cultures x2 collected prior to initiation of IV antibiotics.  COVID-19/influenza PCR negative.  Imaging and additional notable ED work-up: Chest x-ray showed no evidence of acute cardiopulmonary process.  CT abdomen/pelvis with contrast showed sigmoid colonic diverticulosis with focal wall thickening and adjacent fat stranding consistent with acute sigmoid colonic diverticulitis, without evidence of adjacent fluid collection or abscess, nor any associated evidence of perforation or bowel obstruction.  Otherwise, no evidence of acute intra-abdominal or acute intrapelvic process.  While in the ED, the following were administered: Zofran 4 mg IV x1, Rocephin, Flagyl, normal saline x1 L bolus.  Subsequently, the patient was admitted for further evaluation management of severe sepsis due to acute sigmoid colonic diverticulitis with presentation also notable for dehydration.   Review of Systems: As per HPI otherwise 10 point review of systems negative.   Past Medical History:  Diagnosis Date   Down syndrome    GERD (gastroesophageal reflux disease)     Past Surgical History:  Procedure Laterality Date   MYRINGOTOMY     MYRINGOTOMY WITH TUBE PLACEMENT Right 08/08/2019   Procedure: MYRINGOTOMY WITH T-TUBE PLACEMENT;  Surgeon: Clyde Canterbury, MD;  Location: [place];  Service: ENT;  Laterality: Right;  needs to stay first   NO PAST SURGERIES      Social History:  reports that he has never smoked. He has never used smokeless tobacco. He reports that he does not drink alcohol and does not use drugs.   No Known Allergies  Family History  Problem Relation Age of Onset   Hypertension Mother    Cancer Mother     Migraines Father     Family history reviewed and not pertinent    Prior to Admission medications   Medication Sig Start Date End Date Taking? Authorizing Provider  diclofenac Sodium (VOLTAREN) 1 % GEL Apply 4 g topically 4 (four) times daily as needed. 09/03/20   Hilts, Legrand Como, MD  ibuprofen (ADVIL) 800 MG tablet Take 800 mg by mouth every 8 (eight) hours as needed.    [provider]  omeprazole (PRILOSEC) 20 MG capsule Take 20 mg by mouth daily. 06/01/16   [provider]     Objective    Physical Exam: Vitals:   09/26/21 2215 09/26/21 2230 09/26/21 2245 09/26/21 2345  BP: 133/72 120/67 110/65 118/64  Pulse: 91 88 86 88  Resp:  _0 Temp:      TempSrc:      SpO2: 90% 97% 97% 97%  Weight:      Height:        General: appears to be stated age; alert, oriented Skin: warm, dry, no rash Head:  AT/[place] Mouth:  Oral mucosa membranes appear dry, normal dentition Neck: supple; trachea midline Heart:  RRR; did not appreciate any M/R/G Lungs: CTAB, did not appreciate any wheezes, rales, or rhonchi Abdomen: + BS; soft, ND, tenderness to palpation over left lower quadrant, in the absence of any associated guarding, rigidity, or rebound tenderness. Vascular: 2+ pedal pulses b/l; 2+ radial pulses b/l Extremities: no peripheral edema, no muscle wasting Neuro: strength and sensation intact in upper and lower extremities b/l      Labs on Admission: I have personally reviewed following labs and imaging studies  CBC: Recent Labs  Lab 09/26/21 2135  WBC 13.8*  NEUTROABS 12.7*  HGB 13.4  HCT 39.8  MCV 92.1  PLT 161   Basic Metabolic Panel: Recent Labs  Lab 09/26/21 2135  NA 141  K 3.7  CL 106  CO2 25  GLUCOSE 154*  BUN 16  CREATININE 1.38*  CALCIUM 8.6*   GFR: Estimated Creatinine Clearance: 66.9 mL/min (A) (by C-G formula based on SCr of 1.38 mg/dL (H)). Liver Function Tests: Recent Labs  Lab 09/26/21 2135  AST 22  ALT 24  ALKPHOS 63   BILITOT 0.8  PROT 7.2  ALBUMIN 3.9   Recent Labs  Lab 09/26/21 2135  LIPASE 27   No results for input(s): "AMMONIA" in the last 168 hours. Coagulation Profile: No results for input(s): "INR", "PROTIME" in the last 168 hours. Cardiac Enzymes: No results for input(s): "CKTOTAL", "CKMB", "CKMBINDEX", "TROPONINI" in the last 168 hours. BNP (last 3 results) No results for input(s): "PROBNP" in the last 8760 hours. HbA1C: No results for input(s): "HGBA1C" in the last 72 hours. CBG: No results for input(s): "GLUCAP" in the last 168 hours. Lipid Profile: No results for input(s): "CHOL", "HDL", "LDLCALC", "TRIG", "CHOLHDL", "LDLDIRECT" in the last 72 hours. Thyroid Function Tests: No results for input(s): "TSH", "T4TOTAL", "FREET4", "T3FREE", "THYROIDAB" in the last 72 hours. Anemia Panel: No results for input(s): "VITAMINB12", "FOLATE", "FERRITIN", "TIBC", "IRON", "RETICCTPCT" in the last 72 hours. Urine analysis:    Component Value Date/Time   COLORURINE YELLOW 09/26/2021 2122   APPEARANCEUR HAZY (A) 09/26/2021 2122  LABSPEC 1.024 09/26/2021 2122   PHURINE 7.0 09/26/2021 2122   GLUCOSEU NEGATIVE 09/26/2021 2122   HGBUR NEGATIVE 09/26/2021 2122   BILIRUBINUR NEGATIVE 09/26/2021 2122   [place] NEGATIVE 09/26/2021 2122   PROTEINUR 30 (A) 09/26/2021 2122   UROBILINOGEN 1.0 08/31/2008 1725   NITRITE NEGATIVE 09/26/2021 2122   LEUKOCYTESUR NEGATIVE 09/26/2021 2122    Radiological Exams on Admission: CT Abdomen Pelvis W Contrast  Result Date: 09/26/2021 CLINICAL DATA:  Abdominal pain EXAM: CT ABDOMEN AND PELVIS WITH CONTRAST TECHNIQUE: Multidetector CT imaging of the abdomen and pelvis was performed using the standard protocol following bolus administration of intravenous contrast. RADIATION DOSE REDUCTION: This exam was performed according to the departmental dose-optimization program which includes automated exposure control, adjustment of the mA and/or kV according to patient  size and/or use of iterative reconstruction technique. CONTRAST:  166m OMNIPAQUE IOHEXOL 300 MG/ML  SOLN COMPARISON:  CT examination dated November 30, 2016. FINDINGS: Lower chest: Trace bilateral pleural effusions. Hepatobiliary: No focal liver abnormality is seen. No gallstones, gallbladder wall thickening, or biliary dilatation. Pancreas: Unremarkable. No pancreatic ductal dilatation or surrounding inflammatory changes. Spleen: Normal in size without focal abnormality. Adrenals/Urinary Tract: Adrenal glands are unremarkable. Kidneys are normal, without renal calculi, focal lesion, or hydronephrosis. Bladder is unremarkable. Stomach/Bowel: Stomach is within normal limits. Appendix appears normal. Sigmoid colonic diverticulosis with focal wall thickening and adjacent fat stranding in the left lower quadrant concerning for acute diverticulitis (series 2 image [id number]; series 5 image [id number]). No adjacent fluid collection or abscess. 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 Abdominal aorta and branch vessels are normal in caliber. Reproductive: Prostate is unremarkable. Other: Fat containing umbilical hernia.  No abdominopelvic ascites. Musculoskeletal: Mild degenerate disc disease of the lumbar spine. No acute osseous abnormality. 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 IMPRESSION: 1. Sigmoid colonic diverticulosis with focal wall thickening and adjacent fat stranding suggesting acute sigmoid colonic diverticulitis. No adjacent fluid collection or abscess. 2.  No evidence of nephrolithiasis or hydronephrosis. 3.  Additional chronic findings as above. Electronically Signed   By: IKeane PoliceD.O.   On: 09/26/2021 23:17   DG Chest 2 View  Result Date: 09/26/2021 CLINICAL DATA:  Cough, vomiting, diarrhea EXAM: CHEST - 2 VIEW COMPARISON:  04/05/2017 FINDINGS: Low lung volumes. No focal consolidation. No pleural effusion or pneumothorax. The  heart is top-normal in size for inspiration. Degenerative changes of the visualized thoracolumbar spine. IMPRESSION: Normal chest radiographs. Electronically Signed   By: SJulian HyM.D.   On: 09/26/2021 22:14       Assessment/Plan    Principal Problem:   Acute diverticulitis Active Problems:   Severe sepsis (HCC)   Dehydration   GERD (gastroesophageal reflux disease)      #) Severe sepsis due to acute sigmoid colonic diverticulitis: In the setting of 2 days of progressive left lower quadrant abdominal discomfort associate with subjective fever, leukocytosis, with CT abdomen/pelvis showing evidence of acute sigmoid colonic diverticulitis without any evidence of associated abscess, nor any evidence of colonic perforation or obstruction.  SIRS criteria met via objective fever, leukocytosis, tachycardia. Lactic acid level: 2.7, with repeat value trending down to 2.2. Of note, given the associated presence of suspected end organ damage in the form of concominant presenting elevated lactate, criteria are met for pt's sepsis to be considered severe in nature. However, in the absence of lactic acid level that is greater than or equal to 4.0, and in the absence  of any associated hypotension refractory to IVF's, there are no indications for administration of a 30 mL/kg IVF bolus at this time.   Additional ED work-up/management notable for: Blood cultures x2 collected prior to initiation Rocephin and Flagyl, which will be continued.  No e/o additional infectious process at this time, including chest x-ray, which showed no evidence of acute cardiopulmonary process, while urinalysis inconsistent with UTI, and COVID-19/influenza PCR negative.   Plan: CBC w/ diff and CMP in AM.  Follow for results of blood cx's x 2. Abx: Continue Rocephin and IV Flagyl.  Continuous lactated Ringer's.  Repeat lactic acid level with morning labs.  Prn IV Dilaudid.  Prn IV Zofran.  As needed acetaminophen for  fever.           #) Dehydration: Clinical suspicion for such, including the appearance of dry oral mucous membranes as well as laboratory findings notable for UA demonstrating elevated specific gravity.Marland Kitchen  Appears to be in the setting of   recent increase in GI losses in the form of 2 days of intermittent nausea/vomiting with concomitant decline in oral intake over that timeframe.  No e/o associated hypotension.   Plan: Monitor strict I's and O's.  Daily weights.  Repeat CMP in the morning.  Continuous IVF's, as above .            #) GERD: documented h/o such; on omeprazole as outpatient.  In the setting of persistent nausea/vomiting, and diminished ability to tolerate p.o. at this time, will transiently convert the patient's oral omeprazole to daily dosing of IV Protonix.  Plan: Protonix 40 mg IV daily, as above.        DVT prophylaxis: SCD's   Code Status: Full code Disposition Plan: Per Rounding Team Consults called: none;  Admission status: Inpatient   PLEASE NOTE THAT DRAGON DICTATION SOFTWARE WAS USED IN THE CONSTRUCTION OF THIS NOTE.   Elmore DO [facility] [place]   09/27/2021, 12:27 AM

## 2021-09-28 DIAGNOSIS — K5792 Diverticulitis of intestine, part unspecified, without perforation or abscess without bleeding: Secondary | ICD-10-CM | POA: Diagnosis not present

## 2021-09-28 LAB — CBC
HCT: 37.2 % — ABNORMAL LOW (ref 39.0–52.0)
Hemoglobin: 12.3 g/dL — ABNORMAL LOW (ref 13.0–17.0)
MCH: 31.2 pg (ref 26.0–34.0)
MCHC: 33.1 g/dL (ref 30.0–36.0)
MCV: 94.4 fL (ref 80.0–100.0)
Platelets: 235 10*3/uL (ref 150–400)
RBC: 3.94 MIL/uL — ABNORMAL LOW (ref 4.22–5.81)
RDW: 14.9 % (ref 11.5–15.5)
WBC: 9.1 10*3/uL (ref 4.0–10.5)
nRBC: 0 % (ref 0.0–0.2)

## 2021-09-28 LAB — BASIC METABOLIC PANEL
Anion gap: 7 (ref 5–15)
BUN: 11 mg/dL (ref 6–20)
CO2: 27 mmol/L (ref 22–32)
Calcium: 8.2 mg/dL — ABNORMAL LOW (ref 8.9–10.3)
Chloride: 107 mmol/L (ref 98–111)
Creatinine, Ser: 1.29 mg/dL — ABNORMAL HIGH (ref 0.61–1.24)
GFR, Estimated: >60 mL/min (ref >60)
Glucose, Bld: 100 mg/dL — ABNORMAL HIGH (ref 70–99)
Potassium: 3.5 mmol/L (ref 3.5–5.1)
Sodium: 141 mmol/L (ref 135–145)

## 2021-09-28 MED ORDER — MORPHINE SULFATE (PF) 2 MG/ML IV SOLN
0.5000 mg | INTRAVENOUS | Status: DC | PRN
  Administered 2021-09-28: 0.5 mg via INTRAVENOUS
  Filled 2021-09-28: qty 1

## 2021-09-28 MED ORDER — ACETAMINOPHEN 160 MG/5ML PO SOLN: 650.0000 mg | Freq: Four times a day (QID) | ORAL | Status: DC | PRN

## 2021-09-28 NOTE — Progress Notes (Signed)
PROGRESS NOTE    Levi Powell  PRX:[id number] DOB: Jul 08, 1985 DOA: 09/26/2021 PCP: Jerl Mina, MD    Brief Narrative:  Levi Powell is a 36 y.o. male with medical history significant for Down syndrome, GERD, who is admitted to [facility] on 09/26/2021 with severe sepsis due to acute sigmoid colonic diverticulitis after presenting from home to [facility] ED complaining of abdominal pain. Found to have diverticulitis.     Assessment and Plan: Severe sepsis due to acute sigmoid colonic diverticulitis:  -IVF and IV abx -advance diet as tolerated -home in 24- 48 hours      Dehydration: -IVF -encourage PO intake   GERD: -PPI  Obesity Estimated body mass index is 39.65 kg/m as calculated from the following:   Height as of this encounter: 5' (1.524 m).   Weight as of this encounter: 92.1 kg.   DVT prophylaxis: SCDs Start: 09/27/21 0023    Code Status: Full Code Family Communication: family at bedside  Disposition Plan:  Level of care: Med-Surg Status is: Inpatient Remains inpatient appropriate because: needs IV abx    Consultants:  none   Subjective: Got pain meds last PM  Objective: Vitals:   09/27/21 2218 09/28/21 0145 09/28/21 0533 09/28/21 0948  BP: 121/77 113/62 134/77 126/78  Pulse: 82 74 77 72  Resp: 18 18 18 18   Temp: 98.3 F (36.8 C) 99.2 F (37.3 C) 98.2 F (36.8 C) 98.1 F (36.7 C)  TempSrc: Oral Oral Oral Oral  SpO2: 95% 93% 94% 97%  Weight:   92.1 kg   Height:        Intake/Output Summary (Last 24 hours) at 09/28/2021 1247 Last data filed at 09/28/2021 0900 Gross per 24 hour  Intake 2381.77 ml  Output 200 ml  Net 2181.77 ml   Filed Weights   09/26/21 2048 09/28/21 0533  Weight: 86.2 kg 92.1 kg    Examination:   General: Appearance:    Obese male in no acute distress     Lungs:      respirations unlabored  Heart:    Normal heart rate. Normal rhythm. No murmurs, rubs, or gallops.    MS:   All extremities are  intact.    Neurologic:   Sleeping, will awake to voice       Data Reviewed: I have personally reviewed following labs and imaging studies  CBC: Recent Labs  Lab 09/26/21 2135 09/27/21 0500 09/28/21 0404  WBC 13.8* 10.7* 9.1  NEUTROABS 12.7* 9.8*  --   HGB 13.4 12.3* 12.3*  HCT 39.8 37.1* 37.2*  MCV 92.1 94.2 94.4  PLT 304 256 235   Basic Metabolic Panel: Recent Labs  Lab 09/26/21 2135 09/27/21 0500 09/28/21 0404  NA 141 142 141  K 3.7 4.0 3.5  CL 106 108 107  CO2 25 25 27   GLUCOSE 154* 113* 100*  BUN 16 14 11   CREATININE 1.38* 1.33* 1.29*  CALCIUM 8.6* 7.9* 8.2*  MG 1.9 2.0  --    GFR: Estimated Creatinine Clearance: 74.1 mL/min (A) (by C-G formula based on SCr of 1.29 mg/dL (H)). Liver Function Tests: Recent Labs  Lab 09/26/21 2135 09/27/21 0500  AST 22 17  ALT 24 21  ALKPHOS 63 53  BILITOT 0.8 0.5  PROT 7.2 6.3*  ALBUMIN 3.9 3.3*   Recent Labs  Lab 09/26/21 2135  LIPASE 27   No results for input(s): "AMMONIA" in the last 168 hours. Coagulation Profile: No results for input(s): "INR", "PROTIME" in the  last 168 hours. Cardiac Enzymes: No results for input(s): "CKTOTAL", "CKMB", "CKMBINDEX", "TROPONINI" in the last 168 hours. BNP (last 3 results) No results for input(s): "PROBNP" in the last 8760 hours. HbA1C: No results for input(s): "HGBA1C" in the last 72 hours. CBG: No results for input(s): "GLUCAP" in the last 168 hours. Lipid Profile: No results for input(s): "CHOL", "HDL", "LDLCALC", "TRIG", "CHOLHDL", "LDLDIRECT" in the last 72 hours. Thyroid Function Tests: No results for input(s): "TSH", "T4TOTAL", "FREET4", "T3FREE", "THYROIDAB" in the last 72 hours. Anemia Panel: No results for input(s): "VITAMINB12", "FOLATE", "FERRITIN", "TIBC", "IRON", "RETICCTPCT" in the last 72 hours. Sepsis Labs: Recent Labs  Lab 09/27/21 0037 09/27/21 0209 09/27/21 0500  LATICACIDVEN 2.7* 2.2* 1.6    Recent Results (from the past 240 hour(s))  Blood  culture (routine x 2)     Status: None (Preliminary result)   Collection Time: 09/26/21  9:35 PM   Specimen: BLOOD  Result Value Ref Range Status   Specimen Description   Final    BLOOD RIGHT ANTECUBITAL Performed at [facility], [address] [place], [place] [address]    Special Requests   Final    BOTTLES DRAWN AEROBIC AND ANAEROBIC Blood Culture results may not be optimal due to an excessive volume of blood received in culture bottles Performed at [facility], [address], [place], [place] [address]    Culture   Final    NO GROWTH 1 DAY Performed at [facility], [address], [place], [place] [address]    Report Status PENDING  Incomplete  Resp Panel by RT-PCR (Flu A&B, Covid) Anterior Nasal Swab     Status: None   Collection Time: 09/26/21 11:51 PM   Specimen: Anterior Nasal Swab  Result Value Ref Range Status   SARS Coronavirus 2 by RT PCR NEGATIVE NEGATIVE Final    Comment: (NOTE) SARS-CoV-2 target nucleic acids are NOT DETECTED.  The SARS-CoV-2 RNA is generally detectable in upper respiratory specimens during the acute phase of infection. The lowest concentration of SARS-CoV-2 viral copies this assay can detect is 138 copies/mL. A negative result does not preclude SARS-Cov-2 infection and should not be used as the sole basis for treatment or other patient management decisions. A negative result may occur with  improper specimen collection/handling, submission of specimen other than nasopharyngeal swab, presence of viral mutation(s) within the areas targeted by this assay, and inadequate number of viral copies(<138 copies/mL). A negative result must be combined with clinical observations, patient history, and epidemiological information. The expected result is Negative.  Fact Sheet for Patients:  BloggerCourse.com  Fact Sheet for Healthcare Providers:   SeriousBroker.it  This test is no t yet approved or cleared by the [place] FDA and  has been authorized for detection and/or diagnosis of SARS-CoV-2 by FDA under an Emergency Use Authorization (EUA). This EUA will remain  in effect (meaning this test can be used) for the duration of the COVID-19 declaration under Section 564(b)(1) of the Act, 21 U.S.C.section 360bbb-3(b)(1), unless the authorization is terminated  or revoked sooner.       Influenza A by PCR NEGATIVE NEGATIVE Final   Influenza B by PCR NEGATIVE NEGATIVE Final    Comment: (NOTE) The Xpert Xpress SARS-CoV-2/FLU/RSV plus assay is intended as an aid in the diagnosis of influenza from Nasopharyngeal swab specimens and should not be used as a sole basis for treatment. Nasal washings and aspirates are unacceptable for Xpert Xpress SARS-CoV-2/FLU/RSV testing.  Fact  Sheet for Patients: BloggerCourse.com  Fact Sheet for Healthcare Providers: SeriousBroker.it  This test is not yet approved or cleared by the [place] FDA and has been authorized for detection and/or diagnosis of SARS-CoV-2 by FDA under an Emergency Use Authorization (EUA). This EUA will remain in effect (meaning this test can be used) for the duration of the COVID-19 declaration under Section 564(b)(1) of the Act, 21 U.S.C. section 360bbb-3(b)(1), unless the authorization is terminated or revoked.  Performed at [facility], [address], [place], [place] [address]   Blood culture (routine x 2)     Status: None (Preliminary result)   Collection Time: 09/27/21 12:37 AM   Specimen: BLOOD  Result Value Ref Range Status   Specimen Description   Final    BLOOD BLOOD RIGHT HAND Performed at [facility], [address] [place], [place] [address]    Special Requests   Final    BOTTLES DRAWN AEROBIC AND ANAEROBIC Blood Culture  adequate volume Performed at [facility], [address], [place], [place] [address]    Culture   Final    NO GROWTH 1 DAY Performed at [facility], [address] [place], [place] [address]    Report Status PENDING  Incomplete         Radiology Studies: CT Abdomen Pelvis W Contrast  Result Date: 09/26/2021 CLINICAL DATA:  Abdominal pain EXAM: CT ABDOMEN AND PELVIS WITH CONTRAST TECHNIQUE: Multidetector CT imaging of the abdomen and pelvis was performed using the standard protocol following bolus administration of intravenous contrast. RADIATION DOSE REDUCTION: This exam was performed according to the departmental dose-optimization program which includes automated exposure control, adjustment of the mA and/or kV according to patient size and/or use of iterative reconstruction technique. CONTRAST:  OMNIPAQUE IOHEXOL 300 MG/ML  SOLN COMPARISON:  CT examination dated November 30, 2016. FINDINGS: Lower chest: Trace bilateral pleural effusions. Hepatobiliary: No focal liver abnormality is seen. No gallstones, gallbladder wall thickening, or biliary dilatation. Pancreas: Unremarkable. No pancreatic ductal dilatation or surrounding inflammatory changes. Spleen: Normal in size without focal abnormality. Adrenals/Urinary Tract: Adrenal glands are unremarkable. Kidneys are normal, without renal calculi, focal lesion, or hydronephrosis. Bladder is unremarkable. Stomach/Bowel: Stomach is within normal limits. Appendix appears normal. Sigmoid colonic diverticulosis with focal wall thickening and adjacent fat stranding in the left lower quadrant concerning for acute diverticulitis (series 2 image 66-74; series 5 image 61-64). No adjacent fluid collection or abscess. 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 Abdominal aorta and branch vessels are normal in caliber. Reproductive: Prostate is unremarkable. Other: Fat containing umbilical hernia.  No abdominopelvic ascites.  Musculoskeletal: Mild degenerate disc disease of the lumbar spine. No acute osseous abnormality. 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 IMPRESSION: 1. Sigmoid colonic diverticulosis with focal wall thickening and adjacent fat stranding suggesting acute sigmoid colonic diverticulitis. No adjacent fluid collection or abscess. 2.  No evidence of nephrolithiasis or hydronephrosis. 3.  Additional chronic findings as above. Electronically Signed   By: Larose Hires D.O.   On: 09/26/2021 23:17   DG Chest 2 View  Result Date: 09/26/2021 CLINICAL DATA:  Cough, vomiting, diarrhea EXAM: CHEST - 2 VIEW COMPARISON:  04/05/2017 FINDINGS: Low lung volumes. No focal consolidation. No pleural effusion or pneumothorax. The heart is top-normal in size for inspiration. Degenerative changes of the visualized thoracolumbar spine. IMPRESSION: Normal chest radiographs. Electronically Signed   By: Roselie Awkward.D.  On: 09/26/2021 22:14        Scheduled Meds:  pantoprazole (PROTONIX) IV  40 mg Intravenous Q24H   Continuous Infusions:  cefTRIAXone (ROCEPHIN)  IV 2 g (09/27/21 2040)   lactated ringers 75 mL/hr at 09/28/21 1210   metronidazole 500 mg (09/28/21 0936)     LOS: 1 day    Time spent: 45 minutes spent on chart review, discussion with nursing staff, consultants, updating family and interview/physical exam; more than 50% of that time was spent in counseling and/or coordination of care.    Joseph Art, DO [facility] Available via Epic secure chat 7am-7pm After these hours, please refer to coverage provider listed on [email or web address] 09/28/2021, 12:47 PM

## 2021-09-29 ENCOUNTER — Other Ambulatory Visit (HOSPITAL_COMMUNITY): Payer: Self-pay

## 2021-09-29 DIAGNOSIS — K5792 Diverticulitis of intestine, part unspecified, without perforation or abscess without bleeding: Secondary | ICD-10-CM | POA: Diagnosis not present

## 2021-09-29 LAB — BASIC METABOLIC PANEL
Anion gap: 6 (ref 5–15)
BUN: 9 mg/dL (ref 6–20)
CO2: 28 mmol/L (ref 22–32)
Calcium: 8.4 mg/dL — ABNORMAL LOW (ref 8.9–10.3)
Chloride: 106 mmol/L (ref 98–111)
Creatinine, Ser: 0.98 mg/dL (ref 0.61–1.24)
GFR, Estimated: >60 mL/min (ref >60)
Glucose, Bld: 94 mg/dL (ref 70–99)
Potassium: 3.3 mmol/L — ABNORMAL LOW (ref 3.5–5.1)
Sodium: 140 mmol/L (ref 135–145)

## 2021-09-29 MED ORDER — TRAMADOL HCL 50 MG PO TABS
50.0000 mg | ORAL_TABLET | Freq: Four times a day (QID) | ORAL | 0 refills | Status: DC | PRN
Start: 2021-09-29 — End: 2021-09-29
  Filled 2021-09-29: qty 6, 2d supply, fill #0

## 2021-09-29 MED ORDER — AMOXICILLIN-POT CLAVULANATE 400-57 MG/5ML PO SUSR: 800.0000 mg | Freq: Two times a day (BID) | ORAL | 0 refills | Status: DC

## 2021-09-29 MED ORDER — POTASSIUM CHLORIDE CRYS ER 20 MEQ PO TBCR
40.0000 meq | EXTENDED_RELEASE_TABLET | Freq: Once | ORAL | Status: AC
  Administered 2021-09-29: 40 meq via ORAL
  Filled 2021-09-29: qty 2

## 2021-09-29 MED ORDER — TRAMADOL HCL 50 MG PO TABS: 50.0000 mg | ORAL_TABLET | Freq: Four times a day (QID) | ORAL | Status: DC | PRN

## 2021-09-29 MED ORDER — AMOXICILLIN-POT CLAVULANATE 875-125 MG PO TABS
1.0000 | ORAL_TABLET | Freq: Two times a day (BID) | ORAL | Status: DC
  Administered 2021-09-29: 1 via ORAL
  Filled 2021-09-29: qty 1

## 2021-09-29 MED ORDER — AMOXICILLIN-POT CLAVULANATE 400-57 MG/5ML PO SUSR
800.0000 mg | Freq: Two times a day (BID) | ORAL | Status: DC
  Filled 2021-09-29 (×2): qty 10

## 2021-09-29 MED ORDER — TRAMADOL HCL 50 MG PO TABS: 50.0000 mg | ORAL_TABLET | Freq: Four times a day (QID) | ORAL | 0 refills | Status: DC | PRN

## 2021-09-29 MED ORDER — PANTOPRAZOLE SODIUM 40 MG PO TBEC: 40.0000 mg | DELAYED_RELEASE_TABLET | Freq: Every day | ORAL | Status: DC

## 2021-09-29 MED ORDER — AMOXICILLIN-POT CLAVULANATE 875-125 MG PO TABS: 1.0000 | ORAL_TABLET | Freq: Two times a day (BID) | ORAL | 0 refills | Status: DC

## 2021-09-29 MED ORDER — ACETAMINOPHEN 160 MG/5ML PO SOLN
650.0000 mg | Freq: Four times a day (QID) | ORAL | 0 refills | Status: DC | PRN
Start: 2021-09-29 — End: 2023-10-27

## 2021-09-29 MED ORDER — AMOXICILLIN-POT CLAVULANATE 875-125 MG PO TABS
1.0000 | ORAL_TABLET | Freq: Two times a day (BID) | ORAL | 0 refills | Status: DC
  Filled 2021-09-29: qty 10, 5d supply, fill #0

## 2021-09-29 MED ORDER — PANTOPRAZOLE 2 MG/ML SUSPENSION
40.0000 mg | Freq: Every day | ORAL | Status: DC
  Filled 2021-09-29: qty 20

## 2021-09-29 NOTE — Progress Notes (Signed)
  Transition of Care (TOC) Screening Note   Patient Details  Name: Levi Powell Date of Birth: 1985-05-16   Transition of Care [facility]) CM/SW Contact:    Amada Jupiter, LCSW Phone Number: 09/29/2021, 10:03 AM    Transition of Care Department [facility]) has reviewed patient and no TOC needs have been identified at this time. We will continue to monitor patient advancement through interdisciplinary progression rounds. If new patient transition needs arise, please place a TOC consult.

## 2021-09-29 NOTE — Discharge Summary (Addendum)
Physician Discharge Summary  Levi Powell [id number] DOB: 1985-04-07 DOA: 09/26/2021  PCP: Maryland Pink, MD  Admit date: 09/26/2021 Discharge date: 09/29/2021  Admitted From: home Discharge disposition: home   Recommendations for Outpatient Follow-Up:   Finish treatment for diverticulitis    Discharge Diagnosis:   Principal Problem:   Acute diverticulitis Active Problems:   Severe sepsis ([place])   Dehydration   GERD (gastroesophageal reflux disease)   Diverticulitis    Discharge Condition: Improved.  Diet recommendation: Low residue/soft  Wound care: None.  Code status: Full.   History of Present Illness:   Levi Powell is a 36 y.o. male with medical history significant for Down syndrome, GERD, who is admitted to [facility] on 09/26/2021 with severe sepsis due to acute sigmoid colonic diverticulitis after presenting from home to [facility] ED complaining of abdominal pain.    The following history is provided by the patient as well as my discussions with the patient's mother, who is present at bedside, in addition to my discussions with the EDP and via chart review.   The patient has been experiencing 2 days of progressive left lower quadrant abdominal discomfort, which has been constant over that timeframe, worse with palpation over the abdomen.  Associated with intermittent nausea resulting in at least 3-4 episodes of nonbloody, nonbilious emesis over that timeframe.  Consequently, [place] has been declining oral intake of both food and water for the last 1 to 2 days.  Sent 1-2 loose stools over the last day, in the absence of any associated melena or hematochezia.  This is also been associated with subjective fever at home.    Not associate with any shortness of breath, cough, rash, dysuria.  No chest pain.    Hospital Course by Problem:   Severe sepsis due to acute sigmoid colonic diverticulitis:  -much improved -PO abx to finish  treatment -close follow up with PCP   Hypokalemia -replete   Dehydration: -resolved with IVF   GERD: -PPI   Obesity Estimated body mass index is 39.65 kg/m as calculated from the following:   Height as of this encounter: 5' (1.524 m).   Weight as of this encounter: 92.1 kg.     Medical Consultants:   none   Discharge Exam:   Vitals:   09/29/21 0543 09/29/21 1400  BP: 123/76 116/75  Pulse: 75 80  Resp: 18 18  Temp: 97.7 F (36.5 C) 98.1 F (36.7 C)  SpO2: 96% 94%   Vitals:   09/28/21 2049 09/29/21 0205 09/29/21 0543 09/29/21 1400  BP: 119/80  123/76 116/75  Pulse: 69  75 80  Resp: 18  18 18   Temp: 98 F (36.7 C)  97.7 F (36.5 C) 98.1 F (36.7 C)  TempSrc: Oral  Oral Oral  SpO2: 93%  96% 94%  Weight:  87.6 kg    Height:        General exam: Appears calm and comfortable. -- wants a hamburger   The results of significant diagnostics from this hospitalization (including imaging, microbiology, ancillary and laboratory) are listed below for reference.     Procedures and Diagnostic Studies:   CT Abdomen Pelvis W Contrast  Result Date: 09/26/2021 CLINICAL DATA:  Abdominal pain EXAM: CT ABDOMEN AND PELVIS WITH CONTRAST TECHNIQUE: Multidetector CT imaging of the abdomen and pelvis was performed using the standard protocol following bolus administration of intravenous contrast. RADIATION DOSE REDUCTION: This exam was performed according to the departmental dose-optimization program which includes  automated exposure control, adjustment of the mA and/or kV according to patient size and/or use of iterative reconstruction technique. CONTRAST:  161mL OMNIPAQUE IOHEXOL 300 MG/ML  SOLN COMPARISON:  CT examination dated November 30, 2016. FINDINGS: Lower chest: Trace bilateral pleural effusions. Hepatobiliary: No focal liver abnormality is seen. No gallstones, gallbladder wall thickening, or biliary dilatation. Pancreas: Unremarkable. No pancreatic ductal dilatation or  surrounding inflammatory changes. Spleen: Normal in size without focal abnormality. Adrenals/Urinary Tract: Adrenal glands are unremarkable. Kidneys are normal, without renal calculi, focal lesion, or hydronephrosis. Bladder is unremarkable. Stomach/Bowel: Stomach is within normal limits. Appendix appears normal. Sigmoid colonic diverticulosis with focal wall thickening and adjacent fat stranding in the left lower quadrant concerning for acute diverticulitis (series 2 image 66-74; series 5 image 61-64). No adjacent fluid collection or abscess. 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 Abdominal aorta and branch vessels are normal in caliber. Reproductive: Prostate is unremarkable. Other: Fat containing umbilical hernia.  No abdominopelvic ascites. Musculoskeletal: Mild degenerate disc disease of the lumbar spine. No acute osseous abnormality. 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 IMPRESSION: 1. Sigmoid colonic diverticulosis with focal wall thickening and adjacent fat stranding suggesting acute sigmoid colonic diverticulitis. No adjacent fluid collection or abscess. 2.  No evidence of nephrolithiasis or hydronephrosis. 3.  Additional chronic findings as above. Electronically Signed   By: Keane Police D.O.   On: 09/26/2021 23:17   DG Chest 2 View  Result Date: 09/26/2021 CLINICAL DATA:  Cough, vomiting, diarrhea EXAM: CHEST - 2 VIEW COMPARISON:  04/05/2017 FINDINGS: Low lung volumes. No focal consolidation. No pleural effusion or pneumothorax. The heart is top-normal in size for inspiration. Degenerative changes of the visualized thoracolumbar spine. IMPRESSION: Normal chest radiographs. Electronically Signed   By: Julian Hy M.D.   On: 09/26/2021 22:14     Labs:   Basic Metabolic Panel: Recent Labs  Lab 09/26/21 2135 09/27/21 0500 09/28/21 0404 09/29/21 0420  NA 141 142 141 140  K 3.7 4.0 3.5 3.3*  CL 106 108  107 106  CO2 25 25 27 28   GLUCOSE 154* 113* 100* 94  BUN 16 14 11 9   CREATININE 1.38* 1.33* 1.29* 0.98  CALCIUM 8.6* 7.9* 8.2* 8.4*  MG 1.9 2.0  --   --    GFR Estimated Creatinine Clearance: 94.9 mL/min (by C-G formula based on SCr of 0.98 mg/dL). Liver Function Tests: Recent Labs  Lab 09/26/21 2135 09/27/21 0500  AST 22 17  ALT 24 21  ALKPHOS 63 53  BILITOT 0.8 0.5  PROT 7.2 6.3*  ALBUMIN 3.9 3.3*   Recent Labs  Lab 09/26/21 2135  LIPASE 27   No results for input(s): "AMMONIA" in the last 168 hours. Coagulation profile No results for input(s): "INR", "PROTIME" in the last 168 hours.  CBC: Recent Labs  Lab 09/26/21 2135 09/27/21 0500 09/28/21 0404  WBC 13.8* 10.7* 9.1  NEUTROABS 12.7* 9.8*  --   HGB 13.4 12.3* 12.3*  HCT 39.8 37.1* 37.2*  MCV 92.1 94.2 94.4  PLT 304 256 235   Cardiac Enzymes: No results for input(s): "CKTOTAL", "CKMB", "CKMBINDEX", "TROPONINI" in the last 168 hours. BNP: Invalid input(s): "POCBNP" CBG: No results for input(s): "GLUCAP" in the last 168 hours. D-Dimer No results for input(s): "DDIMER" in the last 72 hours. Hgb A1c No results for input(s): "HGBA1C" in the last 72 hours. Lipid Profile No results for input(s): "CHOL", "HDL", "LDLCALC", "TRIG", "CHOLHDL", "LDLDIRECT" in the last  72 hours. Thyroid function studies No results for input(s): "TSH", "T4TOTAL", "T3FREE", "THYROIDAB" in the last 72 hours.  Invalid input(s): "FREET3" Anemia work up No results for input(s): "VITAMINB12", "FOLATE", "FERRITIN", "TIBC", "IRON", "RETICCTPCT" in the last 72 hours. Microbiology Recent Results (from the past 240 hour(s))  Blood culture (routine x 2)     Status: None (Preliminary result)   Collection Time: 09/26/21  9:35 PM   Specimen: BLOOD  Result Value Ref Range Status   Specimen Description   Final    BLOOD RIGHT ANTECUBITAL Performed at [facility], [address], [place], [place] [address]    Special  Requests   Final    BOTTLES DRAWN AEROBIC AND ANAEROBIC Blood Culture results may not be optimal due to an excessive volume of blood received in culture bottles Performed at [facility], [address], [place], [place] [address]    Culture   Final    NO GROWTH 2 DAYS Performed at [facility], [address], [place], [place] [address]    Report Status PENDING  Incomplete  Resp Panel by RT-PCR (Flu A&B, Covid) Anterior Nasal Swab     Status: None   Collection Time: 09/26/21 11:51 PM   Specimen: Anterior Nasal Swab  Result Value Ref Range Status   SARS Coronavirus 2 by RT PCR NEGATIVE NEGATIVE Final    Comment: (NOTE) SARS-CoV-2 target nucleic acids are NOT DETECTED.  The SARS-CoV-2 RNA is generally detectable in upper respiratory specimens during the acute phase of infection. The lowest concentration of SARS-CoV-2 viral copies this assay can detect is 138 copies/mL. A negative result does not preclude SARS-Cov-2 infection and should not be used as the sole basis for treatment or other patient management decisions. A negative result may occur with  improper specimen collection/handling, submission of specimen other than nasopharyngeal swab, presence of viral mutation(s) within the areas targeted by this assay, and inadequate number of viral copies(<138 copies/mL). A negative result must be combined with clinical observations, patient history, and epidemiological information. The expected result is Negative.  Fact Sheet for Patients:  BloggerCourse.com  Fact Sheet for Healthcare Providers:  SeriousBroker.it  This test is no t yet approved or cleared by the [place] FDA and  has been authorized for detection and/or diagnosis of SARS-CoV-2 by FDA under an Emergency Use Authorization (EUA). This EUA will remain  in effect (meaning this test can be used) for the duration of the COVID-19 declaration  under Section 564(b)(1) of the Act, 21 U.S.C.section 360bbb-3(b)(1), unless the authorization is terminated  or revoked sooner.       Influenza A by PCR NEGATIVE NEGATIVE Final   Influenza B by PCR NEGATIVE NEGATIVE Final    Comment: (NOTE) The Xpert Xpress SARS-CoV-2/FLU/RSV plus assay is intended as an aid in the diagnosis of influenza from Nasopharyngeal swab specimens and should not be used as a sole basis for treatment. Nasal washings and aspirates are unacceptable for Xpert Xpress SARS-CoV-2/FLU/RSV testing.  Fact Sheet for Patients: BloggerCourse.com  Fact Sheet for Healthcare Providers: SeriousBroker.it  This test is not yet approved or cleared by the [place] FDA and has been authorized for detection and/or diagnosis of SARS-CoV-2 by FDA under an Emergency Use Authorization (EUA). This EUA will remain in effect (meaning this test can be used) for the duration of the COVID-19 declaration under Section 564(b)(1) of the Act, 21 U.S.C. section 360bbb-3(b)(1), unless the authorization is terminated or revoked.  Performed at [facility] [address] [place], [place] [address]   Blood culture (routine x 2)     Status: None (Preliminary result)   Collection Time: 09/27/21 12:37 AM   Specimen: BLOOD  Result Value Ref Range Status   Specimen Description   Final    BLOOD BLOOD RIGHT HAND Performed at [facility], [address] [place], [place] [address]    Special Requests   Final    BOTTLES DRAWN AEROBIC AND ANAEROBIC Blood Culture adequate volume Performed at [facility], [address], [place], [place] [address]    Culture   Final    NO GROWTH 2 DAYS Performed at [facility], [address] [place], [place] [address]    Report Status PENDING  Incomplete     Discharge Instructions:   Discharge Instructions     Discharge instructions    Complete by: As directed    Soft/low fiber while on abx   Increase activity slowly   Complete by: As directed       Allergies as of 09/29/2021   No Known Allergies      Medication List     STOP taking these medications    ibuprofen 200 MG tablet Commonly known as: ADVIL       TAKE these medications    acetaminophen 160 MG/5ML solution Commonly known as: TYLENOL Take 20.3 mLs (650 mg total) by mouth every 6 (six) hours as needed for fever or mild pain.   amoxicillin-clavulanate 400-57 MG/5ML suspension Commonly known as: AUGMENTIN Take 10 mLs (800 mg total) by mouth every 12 (twelve) hours.   B-12 PO Take 5 mLs by mouth daily after breakfast.   diclofenac Sodium 1 % Gel Commonly known as: Voltaren Apply 4 g topically 4 (four) times daily as needed. What changed:  how much to take when to take this additional instructions   omeprazole 20 MG capsule Commonly known as: PRILOSEC Take 20 mg by mouth every other day.          Time coordinating discharge: 45 min  Signed:  Joseph Art DO  [facility] 09/29/2021, 3:24 PM

## 2021-09-29 NOTE — Plan of Care (Signed)

## 2021-09-29 NOTE — Care Management Important Message (Signed)
Important Message  Patient Details IM Letter given to the Patient. Name: Levi Powell MRN: [id number] Date of Birth: [birth year]   Medicare Important Message Given:  Yes     Caren Macadam 09/29/2021, 10:08 AM

## 2021-10-02 LAB — CULTURE, BLOOD (ROUTINE X 2)
Culture: NO GROWTH
Culture: NO GROWTH
Special Requests: ADEQUATE

## 2021-12-10 ENCOUNTER — Ambulatory Visit (INDEPENDENT_AMBULATORY_CARE_PROVIDER_SITE_OTHER): Payer: Medicare Other | Admitting: Dermatology

## 2021-12-10 DIAGNOSIS — L720 Epidermal cyst: Secondary | ICD-10-CM | POA: Diagnosis not present

## 2021-12-10 NOTE — Patient Instructions (Signed)
Due to recent changes in healthcare laws, you may see results of your pathology and/or laboratory studies on MyChart before the doctors have had a chance to review them. We understand that in some cases there may be results that are confusing or concerning to you. Please understand that not all results are received at the same time and often the doctors may need to interpret multiple results in order to provide you with the best plan of care or course of treatment. Therefore, we ask that you please give us 2 business days to thoroughly review all your results before contacting the office for clarification. Should we see a critical lab result, you will be contacted sooner.   If You Need Anything After Your Visit  If you have any questions or concerns for your doctor, please call our main line at [phone] and press option 4 to reach your doctor's medical assistant. If no one answers, please leave a voicemail as directed and we will return your call as soon as possible. Messages left after 4 pm will be answered the following business day.   You may also send us a message via MyChart. We typically respond to MyChart messages within 1-2 business days.  For prescription refills, please ask your pharmacy to contact our office. Our fax number is [phone].  If you have an urgent issue when the clinic is closed that cannot wait until the next business day, you can page your doctor at the number below.    Please note that while we do our best to be available for urgent issues outside of office hours, we are not available 24/7.   If you have an urgent issue and are unable to reach us, you may choose to seek medical care at your doctor's office, retail clinic, urgent care center, or emergency room.  If you have a medical emergency, please immediately call 911 or go to the emergency department.  Pager Numbers  - Dr. Kowalski: [phone]  - Dr. Moye: [phone]  - Dr. Stewart:  [phone]  In the event of inclement weather, please call our main line at [phone] for an update on the status of any delays or closures.  Dermatology Medication Tips: Please keep the boxes that topical medications come in in order to help keep track of the instructions about where and how to use these. Pharmacies typically print the medication instructions only on the boxes and not directly on the medication tubes.   If your medication is too expensive, please contact our office at [phone] option 4 or send us a message through MyChart.   We are unable to tell what your co-pay for medications will be in advance as this is different depending on your insurance coverage. However, we may be able to find a substitute medication at lower cost or fill out paperwork to get insurance to cover a needed medication.   If a prior authorization is required to get your medication covered by your insurance company, please allow us 1-2 business days to complete this process.  Drug prices often vary depending on where the prescription is filled and some pharmacies may offer cheaper prices.  The website www.goodrx.com contains coupons for medications through different pharmacies. The prices here do not account for what the cost may be with help from insurance (it may be cheaper with your insurance), but the website can give you the price if you did not use any insurance.  - You can print the associated coupon and take it with   your prescription to the pharmacy.  - You may also stop by our office during regular business hours and pick up a GoodRx coupon card.  - If you need your prescription sent electronically to a different pharmacy, notify our office through [facility] MyChart or by phone at [phone] option 4.     Si Usted Necesita Algo Despus de Su Visita  Tambin puede enviarnos un mensaje a travs de MyChart. Por lo general respondemos a los mensajes de MyChart en el transcurso de 1 a 2  das hbiles.  Para renovar recetas, por favor pida a su farmacia que se ponga en contacto con nuestra oficina. Nuestro nmero de fax es el [phone].  Si tiene un asunto urgente cuando la clnica est cerrada y que no puede esperar hasta el siguiente da hbil, puede llamar/localizar a su doctor(a) al nmero que aparece a continuacin.   Por favor, tenga en cuenta que aunque hacemos todo lo posible para estar disponibles para asuntos urgentes fuera del horario de oficina, no estamos disponibles las 24 horas del da, los 7 das de la semana.   Si tiene un problema urgente y no puede comunicarse con nosotros, puede optar por buscar atencin mdica  en el consultorio de su doctor(a), en una clnica privada, en un centro de atencin urgente o en una sala de emergencias.  Si tiene una emergencia mdica, por favor llame inmediatamente al 911 o vaya a la sala de emergencias.  Nmeros de bper  - Dr. Kowalski: [phone]  - Dra. Moye: [phone]  - Dra. Stewart: [phone]  En caso de inclemencias del tiempo, por favor llame a nuestra lnea principal al [phone] para una actualizacin sobre el estado de cualquier retraso o cierre.  Consejos para la medicacin en dermatologa: Por favor, guarde las cajas en las que vienen los medicamentos de uso tpico para ayudarle a seguir las instrucciones sobre dnde y cmo usarlos. Las farmacias generalmente imprimen las instrucciones del medicamento slo en las cajas y no directamente en los tubos del medicamento.   Si su medicamento es muy caro, por favor, pngase en contacto con nuestra oficina llamando al [phone] y presione la opcin 4 o envenos un mensaje a travs de MyChart.   No podemos decirle cul ser su copago por los medicamentos por adelantado ya que esto es diferente dependiendo de la cobertura de su seguro. Sin embargo, es posible que podamos encontrar un medicamento sustituto a menor costo o llenar un formulario para que el  seguro cubra el medicamento que se considera necesario.   Si se requiere una autorizacin previa para que su compaa de seguros cubra su medicamento, por favor permtanos de 1 a 2 das hbiles para completar este proceso.  Los precios de los medicamentos varan con frecuencia dependiendo del lugar de dnde se surte la receta y alguna farmacias pueden ofrecer precios ms baratos.  El sitio web www.goodrx.com tiene cupones para medicamentos de diferentes farmacias. Los precios aqu no tienen en cuenta lo que podra costar con la ayuda del seguro (puede ser ms barato con su seguro), pero el sitio web puede darle el precio si no utiliz ningn seguro.  - Puede imprimir el cupn correspondiente y llevarlo con su receta a la farmacia.  - Tambin puede pasar por nuestra oficina durante el horario de atencin regular y recoger una tarjeta de cupones de GoodRx.  - Si necesita que su receta se enve electrnicamente a una farmacia diferente, informe a nuestra oficina a travs de MyChart de [place]   o por telfono llamando al [phone] y presione la opcin 4.  

## 2021-12-10 NOTE — Progress Notes (Signed)
   New Patient Visit  Subjective  Levi Powell is a 36 y.o. male who presents for the following: Skin Problem (Check a growth on the right posterior leg x 10 years, now area growing and sometimes painful.). Parents with patient  Patient has Down syndrome.  The following portions of the chart were reviewed this encounter and updated as appropriate:   Tobacco  Allergies  Meds  Problems  Med Hx  Surg Hx  Fam Hx      Review of Systems:  No other skin or systemic complaints except as noted in HPI or Assessment and Plan.  Objective  Well appearing patient in no apparent distress; mood and affect are within normal limits.Pt appears mentally challenged.  A focused examination was performed including right leg. Relevant physical exam findings are noted in the Assessment and Plan.  Right Thigh - Posterior 2.5 cm Subcutaneous nodule.         Assessment & Plan  Epidermal inclusion cyst 2.5 cm Right Thigh - Posterior See photo Benign-appearing. Exam most consistent with an epidermal inclusion cyst. Discussed that a cyst is a benign growth that can grow over time and sometimes get irritated or inflamed. Recommend observation if it is not bothersome. Discussed option of surgical excision to remove it if it is growing, symptomatic, or other changes noted. Please call for new or changing lesions so they can be evaluated.  Patient is mentally challenged Youth worker Syndrome) and is not cooperative and it is difficult to have the patient position himself and to lay flat on his abdomen, so will will not be able to perform surgery removal under local anesthesia here in the office.   Discussed with the parents that we can refer to general surgery for consult to see if they would be able to remove this benign appearing cyst under general anesthesia or sedation due to his mentally challenged state and inability to cooperate.  We will refer to general surgery   Return if symptoms worsen or fail  to improve.  IMarye Round, CMA, am acting as scribe for Sarina Ser, MD  Documentation: I have reviewed the above documentation for accuracy and completeness, and I agree with the above.  Sarina Ser, MD

## 2021-12-20 ENCOUNTER — Encounter: Payer: Self-pay | Admitting: Dermatology

## 2023-04-05 ENCOUNTER — Other Ambulatory Visit (HOSPITAL_COMMUNITY): Payer: Self-pay

## 2023-08-23 IMAGING — US US ABDOMEN LIMITED
1 series · 14 of 14 positions shown · non-contrast
Comparison: None.

CLINICAL DATA: Midline abdominal wall mass slightly above the
umbilicus

EXAM:
ULTRASOUND ABDOMEN LIMITED

[Series 1: us abdomen limited · 14 acquisitions, 14 frames shown]
[im 1/14]
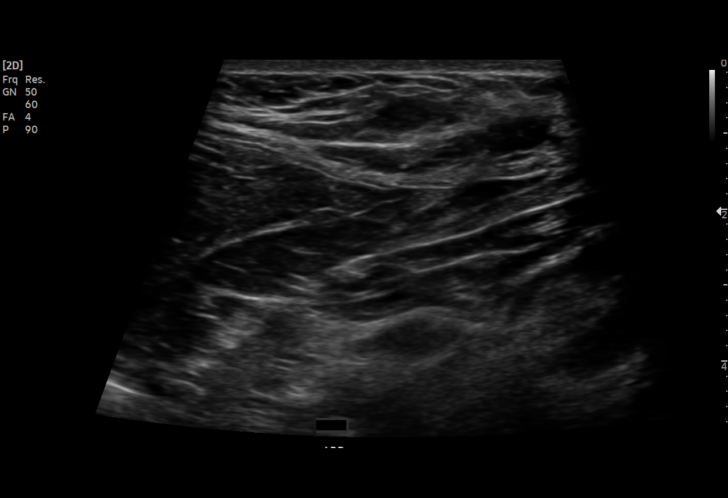
[im 2/14]
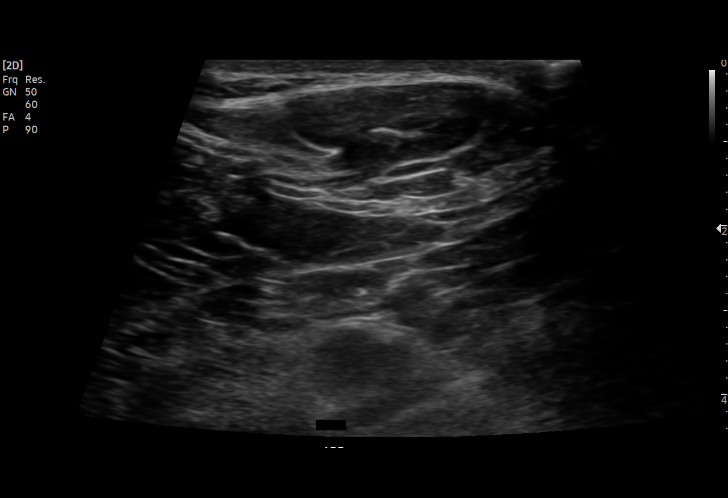
[im 3/14]
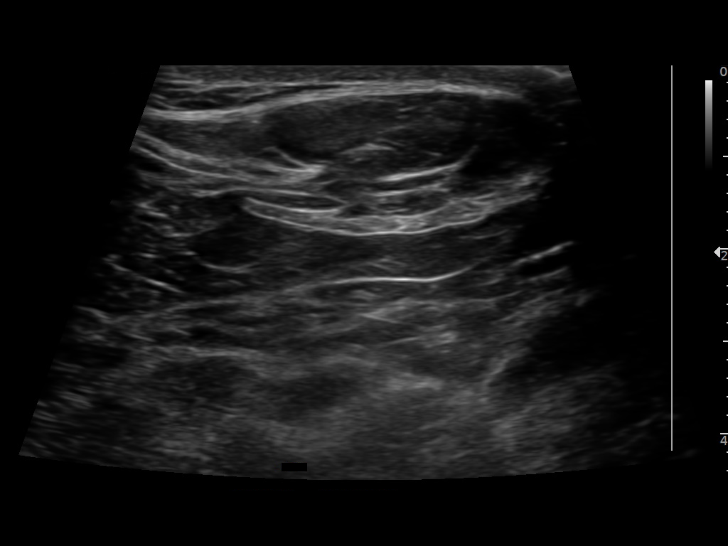
[im 4/14]
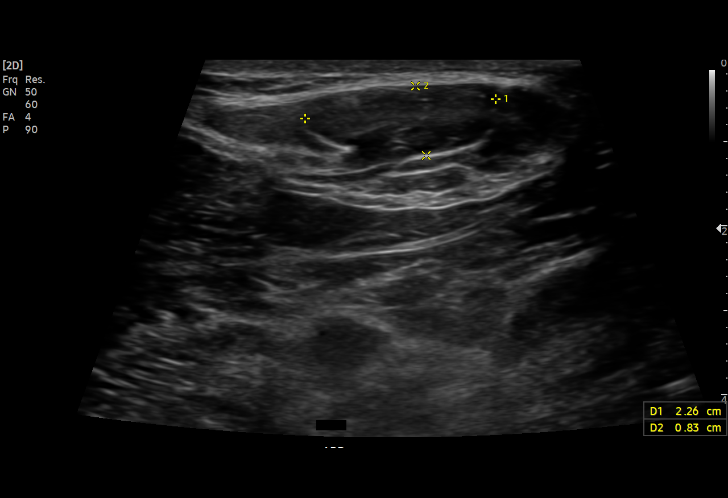
[im 5/14]
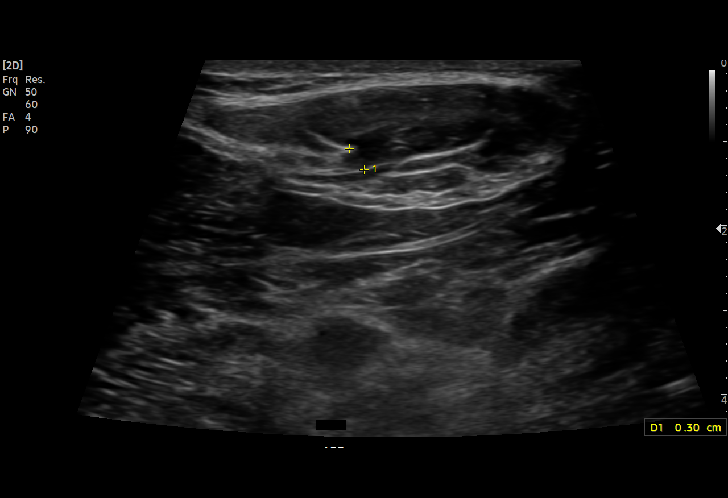
[im 6/14]
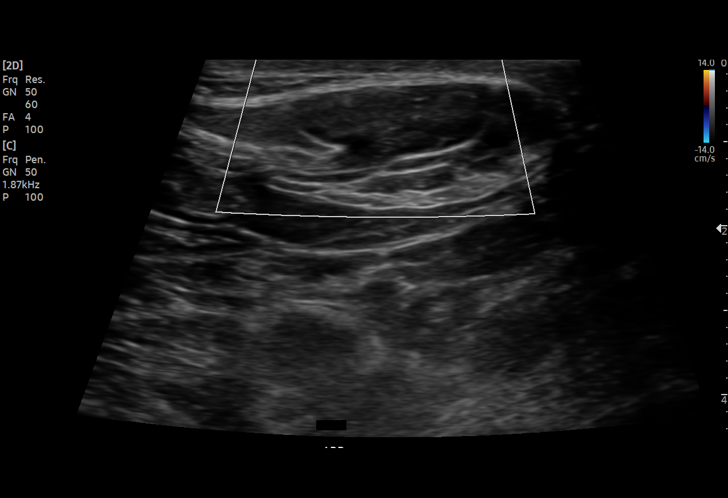
[im 7/14]
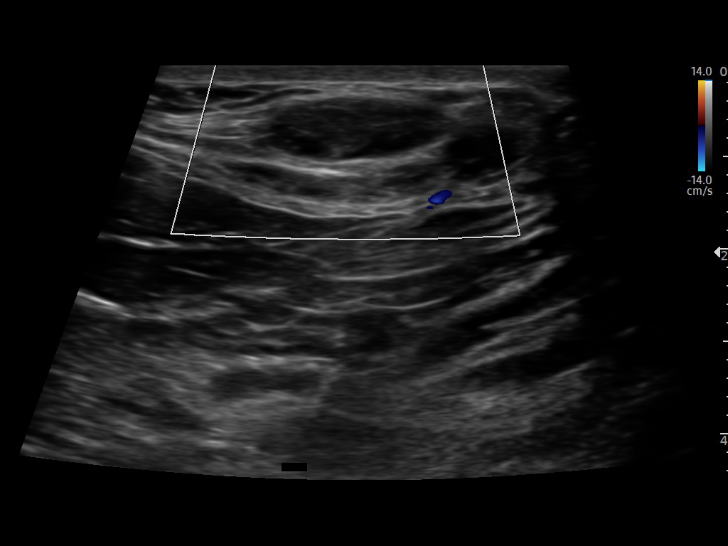
[im 8/14]
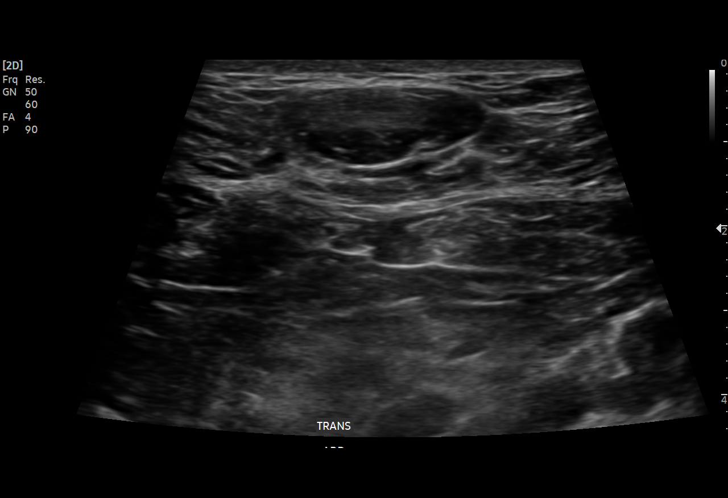
[im 9/14]
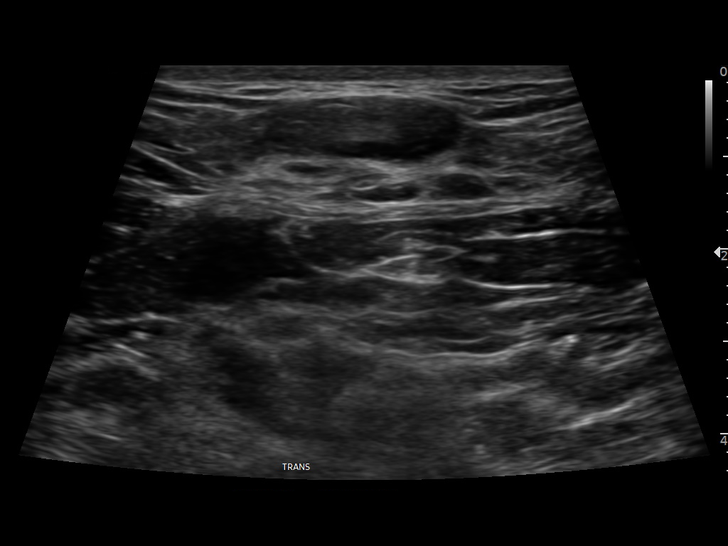
[im 10/14]
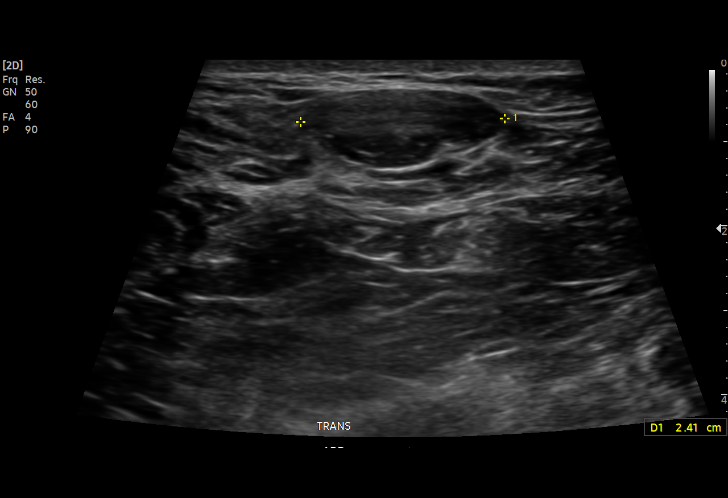
[im 11/14]
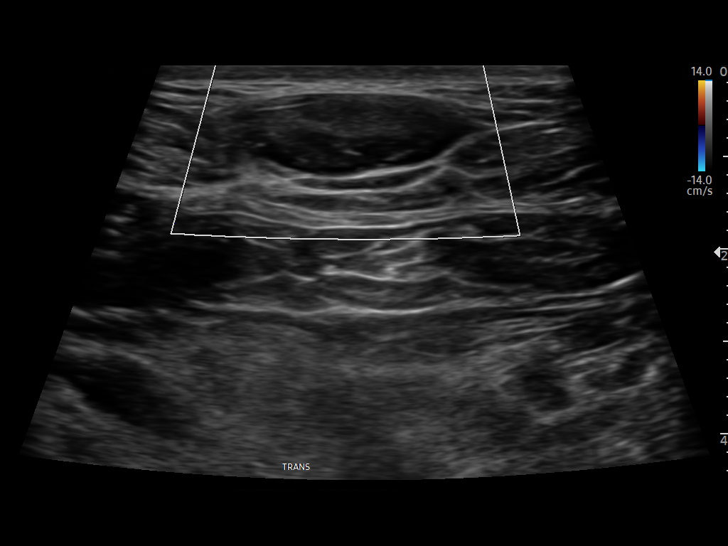
[im 12/14]
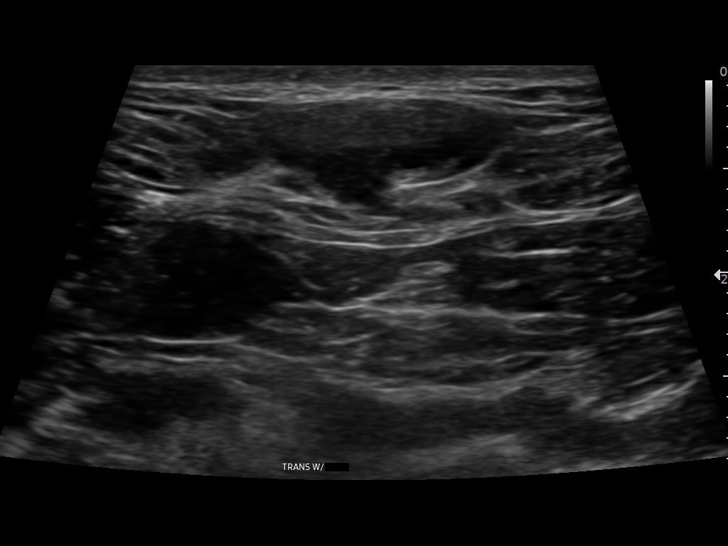
[im 13/14]
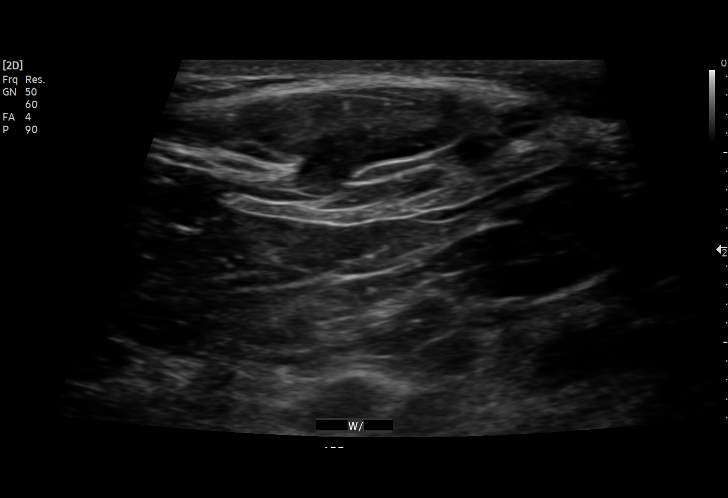
[im 14/14]
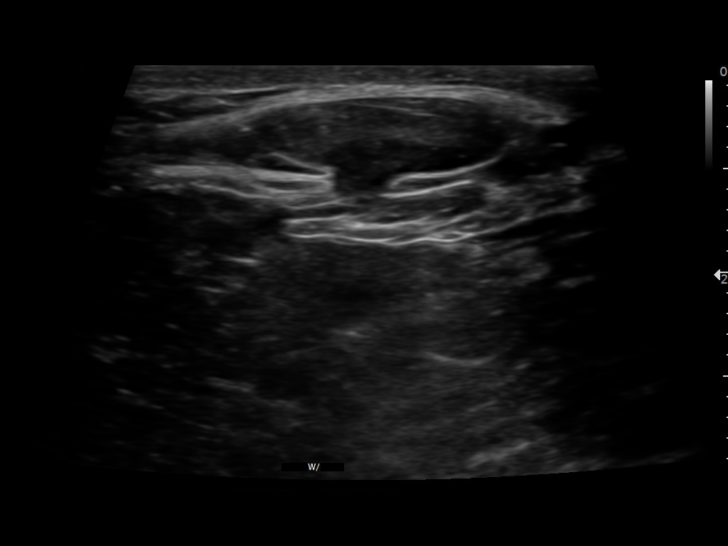

[14 of 14 positions shown; findings below may reference images not displayed]

FINDINGS: Just above the umbilicus there is a 2.3 x 0.8 x 2.4 cm fat
containing hernia with a 3 mm neck identified. This corresponds to
an area of herniated fat seen on prior CT from 5812 although
slightly more prominent on the current exam. No bowel is noted
within. No incarceration is seen.
IMPRESSION: Fat containing supraumbilical hernia slightly larger than that seen
on the prior CT.

## 2023-10-21 ENCOUNTER — Ambulatory Visit: Payer: Self-pay | Admitting: General Surgery

## 2023-10-21 NOTE — H&P (View-Only) (Signed)
 History of Present Illness Levi Powell is a 38 year old male with Down syndrome who presents with a right posterior thigh follicular cyst for evaluation. He is accompanied by his father and sister.  He has had a follicular cyst on his right posterior thigh for approximately ten years. The cyst is the size of a fingernail with a black dot in the center. It has been evaluated by various doctors, who told the family that it was not malignant. Due to a family history of skin cancer, there is concern about the potential for future malignancy.  The cyst does not cause significant discomfort unless touched, at which point it becomes painful. His mother reports that he becomes uncomfortable when the area is pressed.  He has Down syndrome and is non-verbal, which presents challenges in assessing his discomfort and tolerance for procedures.      PAST MEDICAL HISTORY:  Past Medical History:  Diagnosis Date  . Colitis, unspecified   . Down syndrome ([facility]CC)   . Dyslipidemia   . Esophageal reflux   . Gastritis and duodenitis 12/08/2016  . GERD (gastroesophageal reflux disease)         PAST SURGICAL HISTORY:   Past Surgical History:  Procedure Laterality Date  . EGD  10/06/2016   Gastritis: No repeat per RTE  . PE tubes, 1990           MEDICATIONS:  Outpatient Encounter Medications as of 10/21/2023  Medication Sig Dispense Refill  . allopurinoL (ZYLOPRIM) 100 MG tablet Take 1 tablet (100 mg total) by mouth once daily 30 tablet 11  . colchicine (COLCRYS) 0.6 mg tablet Take 2 tablets (1.2 mg) at onset of gout flare. Take 1 additional tablet (0.6 mg) one hour later if symptoms persist. Maximum dose is 1.8 mg (3 tablets) in 24 hours. Then take one tablet (0.6 mg) daily until symptoms resolve. 20 tablet 0  . omeprazole (PRILOSEC) 20 MG DR capsule TAKE 1 CAPSULE(20 MG) BY MOUTH TWICE DAILY 60 capsule 11   No facility-administered encounter medications on file as of 10/21/2023.      ALLERGIES:   Patient has no known allergies.   SOCIAL HISTORY:  Social History   Socioeconomic History  . Marital status: Single  Tobacco Use  . Smoking status: Never    Passive exposure: Never  . Smokeless tobacco: Never  Vaping Use  . Vaping status: Never Used  Substance and Sexual Activity  . Alcohol use: Never  . Drug use: No  . Sexual activity: Defer   Social Drivers of Health   Financial Resource Strain: Patient Declined (06/15/2023)   Overall Financial Resource Strain (CARDIA)   . Difficulty of Paying Living Expenses: Patient declined  Food Insecurity: Patient Declined (06/15/2023)   Hunger Vital Sign   . Worried About Programme researcher, broadcasting/film/video in the Last Year: Patient declined   . Ran Out of Food in the Last Year: Patient declined  Transportation Needs: Patient Declined (06/15/2023)   PRAPARE - Transportation   . Lack of Transportation (Medical): Patient declined   . Lack of Transportation (Non-Medical): Patient declined    FAMILY HISTORY:  Family History  Problem Relation Name Age of Onset  . Breast cancer Mother    . Diabetes Other Grandparents   . Heart disease Other Grandparents       PHYSICAL EXAM:  Vitals:   10/21/23 1445  BP: 128/83  Pulse: 56  .  Ht:152.4 cm (5') Wt:81.6 kg (180 lb) ADJ:Anib surface area is 1.86  meters squared. Body mass index is 35.15 kg/m.SABRA   GENERAL: Alert, no distress  EXTREMITIES: Right posterior thigh 1 cm round mass, firm, no skin changes.   NEUROLOGICAL: Awake alert oriented, facial expression symmetrical, moving all extremities.    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 Although benign, a family history of skin cancer raises concerns about potential malignancy. The cyst is small, about the size of a fingernail, with a central black dot and causes discomfort when touched. Despite the low risk of  cancer, excision is planned to alleviate discomfort and for peace of mind. Due to discomfort and movement concerns related to Down syndrome, the procedure will be performed under sedation as he may not tolerate local anesthesia alone. The excised cyst will be sent for pathological examination to rule out malignancy. Post-operative care will include the use of ice, acetaminophen , ibuprofen, or Advil for pain management. The procedure will be scheduled at the hospital, with the date to be confirmed by the nurse.   Epidermal inclusion cyst [L72.0]          Patient's father and sister verbalized understanding, all questions were answered, and were agreeable with the plan outlined above.   Lucas Sjogren, MD  Electronically signed by Lucas Sjogren, MD

## 2023-10-29 ENCOUNTER — Encounter
Admission: RE | Admit: 2023-10-29 | Discharge: 2023-10-29 | Disposition: A | Discharge status: Home / Self Care | Source: Ambulatory Visit | Attending: General Surgery | Admitting: General Surgery

## 2023-10-29 ENCOUNTER — Other Ambulatory Visit: Payer: Self-pay

## 2023-10-29 HISTORY — DX: Umbilical hernia without obstruction or gangrene: K42.9

## 2023-10-29 HISTORY — DX: Other follicular cysts of the skin and subcutaneous tissue: L72.8

## 2023-10-29 NOTE — Patient Instructions (Addendum)
 Your procedure is scheduled on: 11/05/23 - Friday Report to the Registration Desk on the 1st floor of the [facility]. To find out your arrival time, please call [phone] between 1PM - 3PM on: 11/04/23 - Thursday If your arrival time is 6:00 am, do not arrive before that time as the [facility] entrance doors do not open until 6:00 am.  REMEMBER: Instructions that are not followed completely may result in serious medical risk, up to and including death; or upon the discretion of your surgeon and anesthesiologist your surgery may need to be rescheduled.  Do not eat food or drink any liquids after midnight the night before surgery.  No gum chewing or hard candies.  One week prior to surgery: Stop Anti-inflammatories (NSAIDS) such as Advil, Aleve, Ibuprofen, Motrin, Naproxen, Naprosyn and Aspirin based products such as Excedrin, Goody's Powder, BC Powder. You may take Tylenol  if needed for pain up until the day of surgery.  Stop ANY OVER THE COUNTER supplements until after surgery: COD LIVER OIL and Vitamin C.  ON THE DAY OF SURGERY ONLY TAKE THESE MEDICATIONS WITH SIPS OF WATER:  omeprazole (PRILOSEC)    No Alcohol for 24 hours before or after surgery.  No Smoking including e-cigarettes for 24 hours before surgery.  No chewable tobacco products for at least 6 hours before surgery.  No nicotine patches on the day of surgery.  Do not use any recreational drugs for at least a week (preferably 2 weeks) before your surgery.  Please be advised that the combination of cocaine and anesthesia may have negative outcomes, up to and including death. If you test positive for cocaine, your surgery will be cancelled.  On the morning of surgery brush your teeth with toothpaste and water, you may rinse your mouth with mouthwash if you wish. Do not swallow any toothpaste or mouthwash.  Use CHG Soap or wipes as directed on instruction sheet.  Do not wear jewelry, make-up, hairpins, clips or  nail polish.  For welded (permanent) jewelry: bracelets, anklets, waist bands, etc.  Please have this removed prior to surgery.  If it is not removed, there is a chance that hospital personnel will need to cut it off on the day of surgery.  Do not wear lotions, powders, or perfumes.   Do not shave body hair from the neck down 48 hours before surgery.  Contact lenses, hearing aids and dentures may not be worn into surgery.  Do not bring valuables to the hospital. [facility] is not responsible for any missing/lost belongings or valuables.   Notify your doctor if there is any change in your medical condition (cold, fever, infection).  Wear comfortable clothing (specific to your surgery type) to the hospital.  After surgery, you can help prevent lung complications by doing breathing exercises.  Take deep breaths and cough every 1-2 hours. Your doctor may order a device called an Incentive Spirometer to help you take deep breaths.  When coughing or sneezing, hold a pillow firmly against your incision with both hands. This is called "splinting." Doing this helps protect your incision. It also decreases belly discomfort.  If you are being admitted to the hospital overnight, leave your suitcase in the car. After surgery it may be brought to your room.  In case of increased patient census, it may be necessary for you, the patient, to continue your postoperative care in the Same Day Surgery department.  If you are being discharged the day of surgery, you will not be  allowed to drive home. You will need a responsible individual to drive you home and stay with you for 24 hours after surgery.   If you are taking public transportation, you will need to have a responsible individual with you.  Please call the Pre-admissions Testing Dept. at [phone] if you have any questions about these instructions.  Surgery Visitation Policy:  Patients having surgery or a procedure may have two visitors.   Children under the age of 67 must have an adult with them who is not the patient.  Inpatient Visitation:    Visiting hours are 7 a.m. to 8 p.m. Up to four visitors are allowed at one time in a patient room. The visitors may rotate out with other people during the day.  One visitor age 65 or older may stay with the patient overnight and must be in the room by 8 p.m.   Su procedimiento est programado para el viernes 25/05/09. Presntese en el mostrador de registro, ubicado en Information systems manager del [employer] Para conocer su hora de llegada, llame al [phone] entre la 1 p. m. y las 3 p. m. margart longs 25/04/09. Si su hora de llegada es a las 6:00 a. m., no llegue antes, ya que las puertas de [place] del [address] no abren [employer] 6:00 a. m.  RECUERDE: El incumplimiento de las instrucciones puede resultar en un riesgo mdico grave, que puede incluir la [employer]; o, a discrecin de su cirujano y Scientific laboratory technician, su ciruga podra tener que reprogramarse.  No consuma alimentos ni lquidos despus de la medianoche anterior a la ciruga.  No mastique chicle ni caramelos duros.  Una semana antes de la ciruga: Suspenda los antiinflamatorios (AINE) como Advil, Aleve, ibuprofeno, Motrin, naproxeno, Naprosyn y productos a base de aspirina como Excedrin, Goody's Powder y BC Powder. Puede tomar Tylenol  si lo necesita para Marketing executive de la [place].  Suspenda cualquier suplemento de venta libre hasta despus de la ciruga: aceite de hgado de bacalao y vitamina C.  El da de la ciruga, tome estos medicamentos solo con pequeos tragos de agua:  1. omeprazol (PRILOSEC)  No consuma alcohol durante las 24 horas previas ni posteriores a Metallurgist.  No fume, incluidos los cigarrillos electrnicos, durante las 24 horas previas a la [place]. No consuma tabaco masticable durante al menos 6 horas antes de la [place].  No use parches de nicotina el da de la [place].  No consuma drogas  recreativas durante al menos una semana (preferiblemente 2 semanas) antes de la ciruga.  Tenga en cuenta que la combinacin de cocana y anestesia puede tener consecuencias negativas, incluso la [place]. Si da positivo en la prueba de cocana, se cancelar la ciruga.  La maana de la ciruga, cepllese los dientes con pasta dental y agua; puede enjuagarse la boca con enjuague bucal si lo desea. No ingiera pasta dental ni enjuague bucal.  Use jabn o toallitas CHG segn las instrucciones.  No use joyas, maquillaje, horquillas, broches ni esmalte de uas.  Para joyera soldada (permanente): pulseras, tobilleras, fajas, etc., qutesela antes de la [place]. Si no se la [place], es posible que el personal del hospital tenga que cortarla el da de la [place].  No use lociones, polvos ni perfumes.  No se afeite el vello corporal del cuello para abajo 48 horas antes de la [place].  No se permiten lentes de contacto, audfonos ni dentaduras postizas durante la [place].  No traiga objetos de valor al  hospital. [facility] no se hace responsable de la prdida de pertenencias o objetos de Licensed conveyancer.  Informe a su mdico si observa algn cambio en su estado de salud (resfriado, fiebre, infeccin).  Lleve ropa cmoda (especfica para el tipo de [place]) al hospital.  Despus de la ciruga, puede ayudar a prevenir complicaciones pulmonares realizando ejercicios de respiracin.  Respire profundamente y tosa cada 1 o 2 horas. Su mdico podra recetarle un dispositivo llamado espirmetro incentivador para ayudarle a respirar profundamente.  Al toser o Engineering geologist, sostenga firmemente una almohada contra la incisin con ambas manos. Esto se llama entablillar. Esto ayuda a Engineer, drilling incisin y tambin disminuye las molestias abdominales.  Si va a pasar la noche en el hospital, deje su maleta en el coche. Despus de la ciruga, podran llevarla a su habitacin.  En caso de aumento del nmero de  [place], podra ser necesario que usted, [place], contine su atencin postoperatoria en el servicio de Ciruga Ambulatoria.  Si le dan de alta el da de la ciruga, no se le permitir conducir a casa. Necesitar que una persona responsable lo lleve a casa y lo acompae durante 24 horas despus de la [place].  Si utiliza transporte pblico, deber estar acompaado por una persona responsable.  Si tiene [employer], llame al [employer] de Preadmisin al [phone].  Poltica de Visitas a la Ciruga:  Los pacientes que se sometan a [place] o procedimiento pueden [employer] visitantes.  Los [employer] de 16 aos deben estar acompaados por un adulto que no sea [place].  Visitas a Pacientes Hospitalizados:  El horario de visitas es de 7:00 a. m. a 8:00 p. m. Se permiten hasta cuatro visitantes a la vez en la habitacin del [place]. Los visitantes pueden rotar con Garment/textile technologist.  Un visitante mayor de 16 aos puede quedarse con el paciente durante la noche y debe estar en la habitacin a las 8:00 p. m.    Merchandiser, retail to address health-related social needs:  [email or web address]                  Preparacin para la ciruga con jabn de GLUCONATO DE CLORHEXIDINA (CHG)  Jabn de gluconato de clorhexidina (CHG)  * Un limpiador antisptico que [employer] grmenes y se adhiere a la piel para seguir [employer] grmenes incluso despus del lavado.   *Se utiliza para ducharse la noche anterior a la [place] y la maana de la [place].  Antes de la [place], usted puede desempear un papel importante al reducir la cantidad de grmenes en su piel. El jabn CHG (gluconato de clorhexidina) es un limpiador antisptico que mata los grmenes y se adhiere a la piel para continuar matndolos incluso despus del lavado.  No lo utilice si es alrgico al CHG o a los jabones antibacterianos. Si su piel se  enrojece o irrita, deje de usar CHG.  1. Ducharse la [place]OC[place] ANTES DE LA CIRUGA y la [place] DE LA CIRUGA con jabn CHG.  2. Si eliges lavarte el cabello, lvalo primero como de costumbre con tu champ habitual.  3. Despus del champ, enjuague bien el cabello y el cuerpo para eliminar el champ.  4. Utilice CHG como lo hara con cualquier otro jabn lquido. Puede aplicar CHG directamente sobre la piel y lavar suavemente con un pauelo o una toallita limpia.  5. Aplique el jabn CHG en su cuerpo nicamente desde el cuello hacia abajo. No utilizar  en heridas abiertas o llagas abiertas. Evite el contacto con los ojos, odos, boca y genitales (partes privadas). Lvese la cara y los genitales (partes privadas) con su jabn habitual.  6. Lvese bien, prestando especial atencin al rea donde se realizar su ciruga.  7. Enjuague bien su cuerpo con agua tibia.  8. No se duche ni se lave con su jabn normal despus de usar y enjuagar el jabn CHG.  9. Squese dando palmaditas con una toalla limpia.  10. Use pijamas limpios para dormir la noche anterior a la ciruga.  12. Coloque sbanas limpias en su cama la noche de su primera ducha y no duerma con mascotas.  13. Ducharse nuevamente con el jabn CHG el da de la ciruga antes de llegar al hospital.  14. No aplique desodorantes, lociones o polvos.  15. Por favor use ropa limpia al hospital.                                                                                                             Preparing for Surgery with CHLORHEXIDINE  GLUCONATE (CHG) Soap  Chlorhexidine  Gluconate (CHG) Soap  o An antiseptic cleaner that kills germs and bonds with the skin to continue killing germs even after washing  o Used for showering the night before surgery and morning of surgery  Before surgery, you can play an important role by reducing the number of germs on your skin.  CHG (Chlorhexidine  gluconate) soap is an antiseptic cleanser which  kills germs and bonds with the skin to continue killing germs even after washing.  Please do not use if you have an allergy to CHG or antibacterial soaps. If your skin becomes reddened/irritated stop using the CHG.  1. Shower the NIGHT BEFORE SURGERY and the MORNING OF SURGERY with CHG soap.  2. If you choose to wash your hair, wash your hair first as usual with your normal shampoo.  3. After shampooing, rinse your hair and body thoroughly to remove the shampoo.  4. Use CHG as you would any other liquid soap. You can apply CHG directly to the skin and wash gently with a scrungie or a clean washcloth.  5. Apply the CHG soap to your body only from the neck down. Do not use on open wounds or open sores. Avoid contact with your eyes, ears, mouth, and genitals (private parts). Wash face and genitals (private parts) with your normal soap.  6. Wash thoroughly, paying special attention to the area where your surgery will be performed.  7. Thoroughly rinse your body with warm water.  8. Do not shower/wash with your normal soap after using and rinsing off the CHG soap.  9. Pat yourself dry with a clean towel.  10. Wear clean pajamas to bed the night before surgery.  12. Place clean sheets on your bed the night of your first shower and do not sleep with pets.  13. Shower again with the CHG soap on the day of surgery prior to arriving at the hospital.  14. Do not apply any deodorants/lotions/powders.  15. Please wear clean clothes to the hospital.

## 2023-11-05 ENCOUNTER — Other Ambulatory Visit: Payer: Self-pay

## 2023-11-05 ENCOUNTER — Ambulatory Visit: Payer: Self-pay | Admitting: Urgent Care

## 2023-11-05 ENCOUNTER — Other Ambulatory Visit: Payer: Self-pay | Admitting: General Surgery

## 2023-11-05 ENCOUNTER — Encounter
Admission: RE | Disposition: A | Discharge status: Home / Self Care | Payer: Self-pay | Source: Home / Self Care | Attending: General Surgery

## 2023-11-05 ENCOUNTER — Encounter: Payer: Self-pay | Admitting: General Surgery

## 2023-11-05 ENCOUNTER — Ambulatory Visit: Payer: Self-pay

## 2023-11-05 ENCOUNTER — Ambulatory Visit
Admission: RE | Admit: 2023-11-05 | Discharge: 2023-11-05 | Disposition: A | Discharge status: Home / Self Care | Attending: General Surgery | Admitting: General Surgery

## 2023-11-05 DIAGNOSIS — L720 Epidermal cyst: Secondary | ICD-10-CM | POA: Diagnosis present

## 2023-11-05 DIAGNOSIS — Q909 Down syndrome, unspecified: Secondary | ICD-10-CM | POA: Diagnosis not present

## 2023-11-05 DIAGNOSIS — K219 Gastro-esophageal reflux disease without esophagitis: Secondary | ICD-10-CM | POA: Diagnosis not present

## 2023-11-05 HISTORY — PX: CYST REMOVAL LEG: SHX6280

## 2023-11-05 SURGERY — EXCISION, CYST, LOWER EXTREMITY
Anesthesia: General | Site: Thigh | Laterality: Right

## 2023-11-05 MED ORDER — 0.9 % SODIUM CHLORIDE (POUR BTL) OPTIME
TOPICAL | Status: DC | PRN
  Administered 2023-11-05: 500 mL

## 2023-11-05 MED ORDER — PROPOFOL 10 MG/ML IV BOLUS
INTRAVENOUS | Status: DC | PRN
  Administered 2023-11-05: 150 mg via INTRAVENOUS

## 2023-11-05 MED ORDER — DEXAMETHASONE SODIUM PHOSPHATE 10 MG/ML IJ SOLN
INTRAMUSCULAR | Status: DC | PRN
  Administered 2023-11-05: 5 mg via INTRAVENOUS

## 2023-11-05 MED ORDER — MIDAZOLAM HCL 2 MG/2ML IJ SOLN
INTRAMUSCULAR | Status: DC | PRN
  Administered 2023-11-05 (×2): 1 mg via INTRAVENOUS

## 2023-11-05 MED ORDER — ONDANSETRON HCL 4 MG/2ML IJ SOLN
INTRAMUSCULAR | Status: DC | PRN
  Administered 2023-11-05: 4 mg via INTRAVENOUS

## 2023-11-05 MED ORDER — BUPIVACAINE-EPINEPHRINE 0.5% -1:200000 IJ SOLN
INTRAMUSCULAR | Status: DC | PRN
  Administered 2023-11-05: 10 mL

## 2023-11-05 MED ORDER — PROPOFOL 10 MG/ML IV BOLUS
INTRAVENOUS | Status: AC
  Filled 2023-11-05: qty 20

## 2023-11-05 MED ORDER — ORAL CARE MOUTH RINSE: 15.0000 mL | Freq: Once | OROMUCOSAL | Status: DC

## 2023-11-05 MED ORDER — CEFAZOLIN SODIUM-DEXTROSE 2-4 GM/100ML-% IV SOLN
2.0000 g | INTRAVENOUS | Status: AC
  Administered 2023-11-05: 2 g via INTRAVENOUS

## 2023-11-05 MED ORDER — MIDAZOLAM HCL 2 MG/2ML IJ SOLN
INTRAMUSCULAR | Status: AC
  Filled 2023-11-05: qty 2

## 2023-11-05 MED ORDER — BUPIVACAINE-EPINEPHRINE (PF) 0.5% -1:200000 IJ SOLN
INTRAMUSCULAR | Status: AC
  Filled 2023-11-05: qty 30

## 2023-11-05 MED ORDER — DROPERIDOL 2.5 MG/ML IJ SOLN: 0.6250 mg | Freq: Once | INTRAMUSCULAR | Status: DC | PRN

## 2023-11-05 MED ORDER — SUCCINYLCHOLINE CHLORIDE 200 MG/10ML IV SOSY
PREFILLED_SYRINGE | INTRAVENOUS | Status: DC | PRN
  Administered 2023-11-05: 120 mg via INTRAVENOUS

## 2023-11-05 MED ORDER — CHLORHEXIDINE GLUCONATE 0.12 % MT SOLN: 15.0000 mL | Freq: Once | OROMUCOSAL | Status: DC

## 2023-11-05 MED ORDER — DEXMEDETOMIDINE HCL IN NACL 80 MCG/20ML IV SOLN
INTRAVENOUS | Status: AC
  Filled 2023-11-05: qty 20

## 2023-11-05 MED ORDER — LIDOCAINE HCL (PF) 2 % IJ SOLN
INTRAMUSCULAR | Status: AC
Start: 2023-11-05 — End: 2023-11-05
  Filled 2023-11-05: qty 5

## 2023-11-05 MED ORDER — CEFAZOLIN SODIUM-DEXTROSE 2-4 GM/100ML-% IV SOLN
INTRAVENOUS | Status: AC
  Filled 2023-11-05: qty 100

## 2023-11-05 MED ORDER — PROPOFOL 1000 MG/100ML IV EMUL
INTRAVENOUS | Status: AC
  Filled 2023-11-05: qty 100

## 2023-11-05 MED ORDER — FENTANYL CITRATE (PF) 100 MCG/2ML IJ SOLN: 25.0000 ug | INTRAMUSCULAR | Status: DC | PRN

## 2023-11-05 MED ORDER — CHLORHEXIDINE GLUCONATE 0.12 % MT SOLN
OROMUCOSAL | Status: AC
  Filled 2023-11-05: qty 15

## 2023-11-05 MED ORDER — FENTANYL CITRATE (PF) 100 MCG/2ML IJ SOLN
INTRAMUSCULAR | Status: AC
  Filled 2023-11-05: qty 2

## 2023-11-05 MED ORDER — FENTANYL CITRATE (PF) 100 MCG/2ML IJ SOLN
INTRAMUSCULAR | Status: DC | PRN
  Administered 2023-11-05 (×2): 50 ug via INTRAVENOUS

## 2023-11-05 MED ORDER — PROPOFOL 500 MG/50ML IV EMUL
INTRAVENOUS | Status: DC | PRN
  Administered 2023-11-05: 150 ug/kg/min via INTRAVENOUS

## 2023-11-05 MED ORDER — LACTATED RINGERS IV SOLN: INTRAVENOUS | Status: DC

## 2023-11-05 SURGICAL SUPPLY — 18 items
DERMABOND ADVANCED .7 DNX12 (GAUZE/BANDAGES/DRESSINGS) ×1 IMPLANT
DRAPE LAPAROTOMY 100X77 ABD (DRAPES) ×1 IMPLANT
ELECTRODE REM PT RTRN 9FT ADLT (ELECTROSURGICAL) ×1 IMPLANT
GLOVE BIO SURGEON STRL SZ 6.5 (GLOVE) ×1 IMPLANT
GLOVE BIOGEL PI IND STRL 6.5 (GLOVE) ×1 IMPLANT
GLOVE SURG SYN 6.5 PF PI (GLOVE) ×2 IMPLANT
GOWN STRL REUS W/ TWL LRG LVL3 (GOWN DISPOSABLE) ×3 IMPLANT
KIT TURNOVER KIT A (KITS) ×1 IMPLANT
MANIFOLD NEPTUNE II (INSTRUMENTS) ×1 IMPLANT
NDL HYPO 25X1 1.5 SAFETY (NEEDLE) ×1 IMPLANT
NEEDLE HYPO 25X1 1.5 SAFETY (NEEDLE) ×1 IMPLANT
NS IRRIG 500ML POUR BTL (IV SOLUTION) ×1 IMPLANT
PACK BASIN MINOR ARMC (MISCELLANEOUS) ×1 IMPLANT
SUT ETHILON 3-0 (SUTURE) IMPLANT
SUT VIC AB 3-0 SH 27X BRD (SUTURE) ×1 IMPLANT
SUTURE MNCRL 4-0 27XMF (SUTURE) ×1 IMPLANT
SYR 10ML LL (SYRINGE) ×1 IMPLANT
TRAP FLUID SMOKE EVACUATOR (MISCELLANEOUS) ×1 IMPLANT

## 2023-11-05 NOTE — Anesthesia Preprocedure Evaluation (Signed)
 Anesthesia Evaluation  Patient identified by MRN, date of birth, ID band Patient awake    Reviewed: Allergy & Precautions, H&P , NPO status , Patient's Chart, lab work & pertinent test results, reviewed documented beta blocker date and time   History of Anesthesia Complications Negative for: history of anesthetic complications  Airway Mallampati: III  TM Distance: >3 FB Neck ROM: full  Mouth opening: Limited Mouth Opening  Dental  (+) Dental Advidsory Given, Missing, Teeth Intact   Pulmonary neg pulmonary ROS   Pulmonary exam normal breath sounds clear to auscultation       Cardiovascular Exercise Tolerance: Good negative cardio ROS Normal cardiovascular exam Rhythm:regular Rate:Normal     Neuro/Psych negative neurological ROS  negative psych ROS   GI/Hepatic Neg liver ROS,GERD  ,,  Endo/Other  negative endocrine ROS    Renal/GU negative Renal ROS  negative genitourinary   Musculoskeletal   Abdominal   Peds  Hematology negative hematology ROS (+)   Anesthesia Other Findings Past Medical History: 2023: Diverticulitis No date: Down syndrome (non-verbal) No date: GERD (gastroesophageal reflux disease) No date: Other follicular cysts of the skin and subcutaneous tissue 08/2021: Sepsis (HCC)     Comment:  a.) in setting of severe sigmoid diverticulitis No date: Umbilical hernia   Reproductive/Obstetrics negative OB ROS                              Anesthesia Physical Anesthesia Plan  ASA: 2  Anesthesia Plan: General   Post-op Pain Management:    Induction: Intravenous  PONV Risk Score and Plan: 2 and Ondansetron , Dexamethasone , Midazolam  and Treatment may vary due to age or medical condition  Airway Management Planned: Oral ETT  Additional Equipment:   Intra-op Plan:   Post-operative Plan: Extubation in OR  Informed Consent: I have reviewed the patients History and  Physical, chart, labs and discussed the procedure including the risks, benefits and alternatives for the proposed anesthesia with the patient or authorized representative who has indicated his/her understanding and acceptance.     Dental Advisory Given  Plan Discussed with: Anesthesiologist, CRNA and Surgeon  Anesthesia Plan Comments:         Anesthesia Quick Evaluation

## 2023-11-05 NOTE — Discharge Instructions (Signed)
  Diet: Resume home heart healthy regular diet.   Activity: No specific activity restrictions  Wound care: May shower with soapy water and pat dry (do not rub incisions), but no baths or submerging incision underwater until follow-up. (no swimming)   Medications: Resume all home medications. For mild to moderate pain: acetaminophen  (Tylenol ) or ibuprofen (if no kidney disease).   Call office [phone]) at any time if any questions, worsening pain, fevers/chills, bleeding, drainage from incision site, or other concerns.

## 2023-11-05 NOTE — Op Note (Signed)
 OPERATION REPORT  Pre Operative Diagnosis: Right thigh epidermal inclusion cyst  Post operative diagnosis: Same  Anesthesia: General and Local   Surgeon: Dr. Rodolph   Indication: This 38 y.o. year old male with a soft tissue mass that is causing discomfort and increasing in size   Description of procedure: after orienting patient about the procedure steps and benefits and patient agreed to proceed. Time out was done identifying correct patient and location of procedure. After induction of general anesthesia, local anesthesia was infiltrated around the palpable lesion. With a blade #15, an elliptical incision was made using the skin lines. Sharp dissection was carried down and lesion was excised including dermal tissue. The mass measured 2 cm. Deep dermal stitches were done with vicryl 4-0 to repair the laceration and skin closed with Monocryl 4-0 in subcuticular fashion. Specimen sent to pathology.    Complications: none   EBL: minimal  Lucas Rodolph, MD, FACS

## 2023-11-05 NOTE — Interval H&P Note (Signed)
 History and Physical Interval Note:  11/05/2023 7:26 AM  Levi Powell  has presented today for surgery, with the diagnosis of L72.0 Epidermal inclusion cyst.  The various methods of treatment have been discussed with the patient and family. After consideration of risks, benefits and other options for treatment, the patient has consented to  Procedure(s): EXCISION, CYST, LOWER EXTREMITY (Right) as a surgical intervention.  The patient's history has been reviewed, patient examined, no change in status, stable for surgery.  I have reviewed the patient's chart and labs.  Questions were answered to the patient's satisfaction.     Lucas Sjogren

## 2023-11-05 NOTE — Anesthesia Procedure Notes (Signed)
 Procedure Name: Intubation Date/Time: 11/05/2023 7:38 AM  Performed by: Brien Sotero PARAS, CRNAPre-anesthesia Checklist: Patient identified, Patient being monitored, Timeout performed, Emergency Drugs available and Suction available Patient Re-evaluated:Patient Re-evaluated prior to induction Oxygen Delivery Method: Circle system utilized Preoxygenation: Pre-oxygenation with 100% oxygen Induction Type: IV induction Ventilation: Mask ventilation without difficulty Laryngoscope Size: McGrath and 4 Grade View: Grade I Tube type: Oral Tube size: 6.5 mm Number of attempts: 1 Airway Equipment and Method: Stylet Placement Confirmation: ETT inserted through vocal cords under direct vision, positive ETCO2 and breath sounds checked- equal and bilateral Secured at: 21 cm Tube secured with: Tape Dental Injury: Teeth and Oropharynx as per pre-operative assessment

## 2023-11-05 NOTE — Transfer of Care (Signed)
 Immediate Anesthesia Transfer of Care Note  Patient: Levi Powell  Procedure(s) Performed: EXCISION, CYST, LOWER EXTREMITY (Right: Thigh)  Patient Location: PACU  Anesthesia Type:General  Level of Consciousness: drowsy  Airway & Oxygen Therapy: Patient Spontanous Breathing and Patient connected to face mask oxygen  Post-op Assessment: Report given to RN and Post -op Vital signs reviewed and stable  Post vital signs: Reviewed and stable  Last Vitals:  Vitals Value Taken Time  BP 117/63 11/05/23 08:21  Temp    Pulse 68 11/05/23 08:25  Resp 14 11/05/23 08:25  SpO2 93 % 11/05/23 08:25  Vitals shown include unfiled device data.  Last Pain:  Vitals:   11/05/23 0629  TempSrc: Temporal         Complications: No notable events documented.

## 2023-11-08 ENCOUNTER — Encounter: Payer: Self-pay | Admitting: General Surgery

## 2023-11-08 LAB — SURGICAL PATHOLOGY

## 2023-11-10 ENCOUNTER — Encounter: Payer: Self-pay | Admitting: General Surgery

## 2023-11-11 NOTE — Anesthesia Postprocedure Evaluation (Signed)
 Anesthesia Post Note  Patient: RENO CLASBY  Procedure(s) Performed: EXCISION, CYST, LOWER EXTREMITY (Right: Thigh)  Patient location during evaluation: PACU Anesthesia Type: General Level of consciousness: awake and alert Pain management: pain level controlled Vital Signs Assessment: post-procedure vital signs reviewed and stable Respiratory status: spontaneous breathing, nonlabored ventilation, respiratory function stable and patient connected to nasal cannula oxygen Cardiovascular status: blood pressure returned to baseline and stable Postop Assessment: no apparent nausea or vomiting Anesthetic complications: no   No notable events documented.   Last Vitals:  Vitals:   11/05/23 0845 11/05/23 0902  BP: 96/61 114/71  Pulse: 66 (!) 59  Resp: 16 16  Temp: (!) 36.1 C (!) 36.1 C  SpO2: 94% 95%    Last Pain:  Vitals:   11/05/23 0902  TempSrc:   PainSc: 0-No pain                 Prentice Murphy

## 2024-01-17 ENCOUNTER — Emergency Department (HOSPITAL_COMMUNITY)

## 2024-01-17 ENCOUNTER — Other Ambulatory Visit: Payer: Self-pay

## 2024-01-17 ENCOUNTER — Emergency Department (HOSPITAL_COMMUNITY): Admission: EM | Admit: 2024-01-17 | Discharge: 2024-01-17 | Disposition: A | Discharge status: Home / Self Care

## 2024-01-17 DIAGNOSIS — M7032 Other bursitis of elbow, left elbow: Secondary | ICD-10-CM | POA: Insufficient documentation

## 2024-01-17 DIAGNOSIS — M25522 Pain in left elbow: Secondary | ICD-10-CM | POA: Diagnosis present

## 2024-01-17 DIAGNOSIS — Y9302 Activity, running: Secondary | ICD-10-CM | POA: Diagnosis not present

## 2024-01-17 DIAGNOSIS — Z79899 Other long term (current) drug therapy: Secondary | ICD-10-CM | POA: Diagnosis not present

## 2024-01-17 MED ORDER — METHYLPREDNISOLONE 4 MG PO TBPK: ORAL_TABLET | Freq: Every day | ORAL | 0 refills | Status: AC

## 2024-01-17 NOTE — ED Triage Notes (Signed)
 Patient c/o left elbow pain and swelling x 1 day. Family denies any fall or injury.

## 2024-01-17 NOTE — Discharge Instructions (Addendum)
 You were seen here today for evaluation of your symptoms.  Think you have something called bursitis.  I would like you to follow-up with an orthopedic provider given that this has been a repeated problem for you.  Please call the number listed on this discharge paperwork to call to schedule an appointment.  I am sending you home with a Medrol  Dosepak.  Please take as prescribed.  I have also included some more information into the discharge record for you to review.  If you have any concerns, new or worsening symptoms, please return to your nearest emergency department for reevaluation.  Comunquese con un mdico si: Tiene fiebre. Tiene problemas que no mejoran con scientist, research (medical). Tiene dolor o hinchazn que: Empeora. Desaparece y vuelve a aparecer. Le supura pus del codo. Tiene enrojecimiento alrededor de la zona del codo. El codo se siente caliente al tacto. Solicite ayuda de inmediato si: Tiene dificultad para mover el brazo, la mano o los dedos.

## 2024-01-17 NOTE — ED Provider Notes (Signed)
 [place] EMERGENCY DEPARTMENT AT [facility] Provider Note   CSN: [id number] Arrival date & time: 01/17/24  1556     Patient presents with: Elbow Pain   Levi Powell is a 38 y.o. male with history of Down syndrome, nonverbal, gout, presents emerged from today for evaluation of possible left elbow pain.  Father reports that the patient has difficulty with expressing pain however he has been holding his elbow.  No known trauma however he mentions several possibilities of injury such as lifting up a heavy suitcase yesterday and also may have hit it on a guardrail running with the dog yesterday.  Patient is unable to communicate.  Father reports that he does have a history of gout however has never had an in the elbow before.  Dad reports that he was seen by his PCP earlier and was placed on NSAIDs and finished them around 4 to 5 days ago.  He had resolution of his symptoms with this however now they have since returned.  Level 5 caveat due to patient's nonverbal status.  HPI     Prior to Admission medications   Medication Sig Start Date End Date Taking? Authorizing Provider  allopurinol (ZYLOPRIM) 100 MG tablet Take 100 mg by mouth daily.    [provider]  ascorbic acid (VITAMIN C) 500 MG tablet Take 500 mg by mouth daily.    [provider]  COD LIVER OIL PO Take 5 mLs by mouth daily. Omega 3    [provider]  colchicine 0.6 MG tablet Take 0.6 mg by mouth daily as needed (gout).    [provider]  Cyanocobalamin (B-12 PO) Take 30 mg by mouth daily after breakfast. 5 ml    [provider]  diclofenac  Sodium (VOLTAREN ) 1 % GEL Apply 4 g topically 4 (four) times daily as needed. Patient not taking: Reported on 10/27/2023 09/03/20   Hilts, Michael, MD  ibuprofen (ADVIL) 800 MG tablet Take 800 mg by mouth every 8 (eight) hours as needed (Sick).    [provider]  omeprazole (PRILOSEC) 20 MG capsule Take 20 mg by mouth 2  (two) times daily before a meal. 06/01/16   [provider]    Allergies: Patient has no known allergies.    Review of Systems  Unable to perform ROS: Patient nonverbal    Updated Vital Signs BP 136/81 (BP Location: Right Arm)   Pulse 92   Temp 98.3 F (36.8 C) (Oral)   Resp 16   SpO2 97%   Physical Exam Vitals and nursing note reviewed.  Constitutional:      General: He is not in acute distress.    Appearance: He is not toxic-appearing.  Eyes:     General: No scleral icterus. Cardiovascular:     Pulses:          Radial pulses are 2+ on the left side.  Pulmonary:     Effort: Pulmonary effort is normal.  Musculoskeletal:        General: Tenderness present.     Comments: Appears to have some tenderness.  Patient is able to point to the more lateral epicondyle area.  There is no overlying warmth or erythema.  Questionable some swelling.  Apartments are soft throughout.  Palpable radial pulse.  Patient is able to squeeze my hand does not appear to be in any pain.  Did have pain and let out painful mom when he tried to take off shirt when he was extending  his elbow  Skin:    General: Skin is warm and dry.     Capillary Refill: Capillary refill takes less than 2 seconds.  Neurological:     Mental Status: He is alert.     (all labs ordered are listed, but only abnormal results are displayed) Labs Reviewed - No data to display  EKG: None  Radiology: CT Elbow Left Wo Contrast Result Date: 01/17/2024 EXAM: CT LEFT ELBOW, WITHOUT IV CONTRAST TECHNIQUE: Axial images were acquired through the left elbow without IV contrast. Reformatted images were reviewed. Automated exposure control, iterative reconstruction, and/or weight based adjustment of the mA/kV was utilized to reduce the radiation dose to as low as reasonably achievable. COMPARISON: Left elbow 01/17/2024. CLINICAL HISTORY: Elbow pain, chronic, occult fracture suspected, xray done. FINDINGS: BONES AND JOINTS: Elbow  joint effusion. Chronic radial head deformity. Questioned cortical erosion of the coronoid process of the olecranon (14:41). No acute fracture or focal osseous lesion. No dislocation. SOFT TISSUES: The soft tissues of the elbow are unremarkable. INCIDENTAL FINDINGS: Coronary artery calcification. IMPRESSION: 1. Elbow joint effusion. 2. Questioned cortical erosion of the coronoid process of the olecranon. If high clinical concern for osteomyelitis , consider MRI. 3. No acute fracture or dislocation. Electronically signed by: Morgane Naveau MD 01/17/2024 09:05 PM EST RP Workstation: [id number]   DG Elbow Complete Left Result Date: 01/17/2024 CLINICAL DATA:  Atraumatic posterior left elbow pain. EXAM: LEFT ELBOW - COMPLETE 3+ VIEW COMPARISON:  None Available. FINDINGS: There is no evidence of an acute fracture, dislocation, or joint effusion. A chronic appearing deformity is seen involving the left radial head and neck. There is no evidence of arthropathy or other focal bone abnormality. A posterior distal left humeral fat pad is present, indicative of an elbow joint effusion. IMPRESSION: 1. Chronic appearing deformity of the left radial head and neck. 2. Left elbow joint effusion. Electronically Signed   By: Suzen Dials M.D.   On: 01/17/2024 17:56   Procedures   Medications Ordered in the ED - No data to display  Medical Decision Making Amount and/or Complexity of Data Reviewed Radiology: ordered.  Risk Prescription drug management.   38 y.o. male presents to the ER for evaluation of left elbow pain. Differential diagnosis includes but is not limited to gout, septic arthritis, bursitis, soft tissue injury, bony fracture. Vital signs unremarkable. Physical exam as noted above.   Left elbow XR : 1. Chronic appearing deformity of the left radial head and neck. 2. Left elbow joint effusion. Per radiologist's interpretation.    X-ray was ordered in triage.  History extremely limited due to  patient's nonverbal status.  Question if there was an actual injury.  He does have some pain to the area, there is no overlying warmth or erythema, I doubt any septic arthritis.  He is afebrile with normal pulse rate.  He has full distal pulses and can wiggle his fingers and has a good grip strength.  Will order CT of the left elbow elbow to rule out any occult fracture.  CT Left elbow 1. Elbow joint effusion. 2. Questioned cortical erosion of the coronoid process of the olecranon. If high clinical concern for osteomyelitis , consider MRI. 3. No acute fracture or dislocation. Per radiologist's interpretation.    I have a very low suspicion for osteomyelitis.  He is nontachycardic, afebrile there is no overlying warmth or erythema.  He has had this last week and finished rounds of NSAIDs which improved but then started to worsen after  completion of NSAIDs.  Sounds more consistent with bursitis, I do not feel this is an infectious bursitis.  Will trial with prednisone  taper.  Discussed this with his dad and he agrees that he does not feel the patient is having any infection as he has never had any warmth or erythema to the elbow.  He is left-hand dominant and dad reports that he is constantly braiding his name and moving his arm and moving things around in the house.  I had given information for the provider for him to follow-up with as well.  We discussed the results of the labs/imaging. The plan is prednisone  taper, follow-up with orthopedics. We discussed strict return precautions and red flag symptoms. The patient verbalized their understanding and agrees to the plan. The patient is stable and being discharged home in good condition.  I discussed this case with my attending physician who cosigned this note including patient's presenting symptoms, physical exam, and planned diagnostics and interventions. Attending physician stated agreement with plan or made changes to plan which were implemented.    Portions of this report may have been transcribed using voice recognition software. Every effort was made to ensure accuracy; however, inadvertent computerized transcription errors may be present.    Final diagnoses:  Bursitis of left elbow, unspecified bursa    ED Discharge Orders          Ordered    methylPREDNISolone  (MEDROL  DOSEPAK) 4 MG TBPK tablet  Daily        01/17/24 2140               Bernis Ernst, PA-C 01/17/24 2203    Gennaro Bouchard L, DO 01/20/24 1658

## 2024-01-17 NOTE — ED Provider Triage Note (Cosign Needed Addendum)
 Emergency Medicine Provider Triage Evaluation Note  Levi Powell , a 38 y.o. male  was evaluated in triage.  Pt complains of L elbow pain and swelling. No known injury other than lifting suitcase at home. Patient has hx of down syndrome, father gave history in triage. Father does state that he has hx of L elbow pain and was taking NSAIDs outpatient and doing well. Patient does have history of gout and takes allopurinol daily.   Review of Systems  Positive: L elbow pain and swelling Negative: Fever, chills, injury  Physical Exam  BP 136/81 (BP Location: Right Arm)   Pulse 92   Temp 98.3 F (36.8 C) (Oral)   Resp 16   SpO2 97%  Gen:   Awake, no distress   Resp:  Normal effort  MSK:   Moves extremities without difficulty, L elbow tender to touch and pain with flexion and extension Other:  No obvious rash or deformity  Medical Decision Making  Medically screening exam initiated at 4:57 PM.  Appropriate orders placed.  MOISHY LADAY was informed that the remainder of the evaluation will be completed by another provider, this initial triage assessment does not replace that evaluation, and the importance of remaining in the ED until their evaluation is complete.  Orders: xray L elbow   Janetta Terrall FALCON, PA-C 01/17/24 1700    Janetta Terrall FALCON, PA-C 01/17/24 1703
